# Patient Record
Sex: Female | Born: 1980 | Race: White | Hispanic: No | Marital: Married | Smoking: Current every day smoker
Health system: Southern US, Community
[De-identification: ages and names within clinical notes are randomized; demographics above are authoritative.]

## PROBLEM LIST (undated history)

## (undated) ENCOUNTER — Emergency Department (HOSPITAL_BASED_OUTPATIENT_CLINIC_OR_DEPARTMENT_OTHER): Payer: BLUE CROSS/BLUE SHIELD

## (undated) DIAGNOSIS — B1920 Unspecified viral hepatitis C without hepatic coma: Secondary | ICD-10-CM

## (undated) HISTORY — PX: TUBAL LIGATION: SHX77

## (undated) HISTORY — PX: CHOLECYSTECTOMY: SHX55

---

## 2010-10-29 ENCOUNTER — Emergency Department (HOSPITAL_BASED_OUTPATIENT_CLINIC_OR_DEPARTMENT_OTHER)
Admission: EM | Admit: 2010-10-29 | Discharge: 2010-10-29 | Disposition: A | Discharge status: Home / Self Care | Payer: Self-pay | Attending: Emergency Medicine | Admitting: Emergency Medicine

## 2010-10-29 DIAGNOSIS — L03119 Cellulitis of unspecified part of limb: Secondary | ICD-10-CM | POA: Insufficient documentation

## 2010-10-29 DIAGNOSIS — L02419 Cutaneous abscess of limb, unspecified: Secondary | ICD-10-CM | POA: Insufficient documentation

## 2010-11-02 LAB — CULTURE, ROUTINE-ABSCESS: Gram Stain: NONE SEEN

## 2011-03-04 ENCOUNTER — Emergency Department (HOSPITAL_BASED_OUTPATIENT_CLINIC_OR_DEPARTMENT_OTHER)
Admission: EM | Admit: 2011-03-04 | Discharge: 2011-03-04 | Disposition: A | Discharge status: Home / Self Care | Payer: Self-pay | Attending: Emergency Medicine | Admitting: Emergency Medicine

## 2011-03-04 ENCOUNTER — Emergency Department (INDEPENDENT_AMBULATORY_CARE_PROVIDER_SITE_OTHER): Payer: Self-pay

## 2011-03-04 ENCOUNTER — Encounter: Payer: Self-pay | Admitting: *Deleted

## 2011-03-04 DIAGNOSIS — R112 Nausea with vomiting, unspecified: Secondary | ICD-10-CM

## 2011-03-04 DIAGNOSIS — R1031 Right lower quadrant pain: Secondary | ICD-10-CM | POA: Insufficient documentation

## 2011-03-04 DIAGNOSIS — F17200 Nicotine dependence, unspecified, uncomplicated: Secondary | ICD-10-CM | POA: Insufficient documentation

## 2011-03-04 HISTORY — DX: Unspecified viral hepatitis C without hepatic coma: B19.20

## 2011-03-04 LAB — URINALYSIS, ROUTINE W REFLEX MICROSCOPIC
Bilirubin Urine: NEGATIVE
Nitrite: NEGATIVE
Specific Gravity, Urine: 1.023 (ref 1.005–1.030)
pH: 6 (ref 5.0–8.0)

## 2011-03-04 LAB — CBC
HCT: 41.1 % (ref 36.0–46.0)
MCV: 82.4 fL (ref 78.0–100.0)
Platelets: 297 10*3/uL (ref 150–400)
RBC: 4.99 MIL/uL (ref 3.87–5.11)
WBC: 12.4 10*3/uL — ABNORMAL HIGH (ref 4.0–10.5)

## 2011-03-04 LAB — COMPREHENSIVE METABOLIC PANEL
ALT: 64 U/L — ABNORMAL HIGH (ref 0–35)
Alkaline Phosphatase: 83 U/L (ref 39–117)
CO2: 27 mEq/L (ref 19–32)
Calcium: 9.6 mg/dL (ref 8.4–10.5)
Chloride: 105 mEq/L (ref 96–112)
GFR calc Af Amer: >90 mL/min (ref >90)
GFR calc non Af Amer: >90 mL/min (ref >90)
Glucose, Bld: 95 mg/dL (ref 70–99)
Potassium: 4 mEq/L (ref 3.5–5.1)
Sodium: 142 mEq/L (ref 135–145)
Total Bilirubin: 0.2 mg/dL — ABNORMAL LOW (ref 0.3–1.2)

## 2011-03-04 LAB — URINE MICROSCOPIC-ADD ON

## 2011-03-04 LAB — DIFFERENTIAL
Eosinophils Relative: 4 % (ref 0–5)
Lymphocytes Relative: 34 % (ref 12–46)
Lymphs Abs: 4.2 10*3/uL — ABNORMAL HIGH (ref 0.7–4.0)

## 2011-03-04 MED ORDER — ONDANSETRON HCL 4 MG/2ML IJ SOLN
4.0000 mg | Freq: Once | INTRAMUSCULAR | Status: AC
  Administered 2011-03-04: 4 mg via INTRAVENOUS
  Filled 2011-03-04: qty 2

## 2011-03-04 MED ORDER — MORPHINE SULFATE 4 MG/ML IJ SOLN
4.0000 mg | Freq: Once | INTRAMUSCULAR | Status: AC
  Administered 2011-03-04: 4 mg via INTRAVENOUS
  Filled 2011-03-04: qty 1

## 2011-03-04 MED ORDER — METOCLOPRAMIDE HCL 5 MG/ML IJ SOLN
10.0000 mg | Freq: Once | INTRAMUSCULAR | Status: AC
  Administered 2011-03-04: 10 mg via INTRAVENOUS

## 2011-03-04 MED ORDER — ONDANSETRON HCL 4 MG/2ML IJ SOLN
4.0000 mg | Freq: Once | INTRAMUSCULAR | Status: AC
  Administered 2011-03-04: 4 mg via INTRAVENOUS

## 2011-03-04 MED ORDER — IOHEXOL 300 MG/ML  SOLN
100.0000 mL | Freq: Once | INTRAMUSCULAR | Status: AC | PRN
  Administered 2011-03-04: 100 mL via INTRAVENOUS

## 2011-03-04 MED ORDER — ONDANSETRON 8 MG PO TBDP: 8.0000 mg | ORAL_TABLET | Freq: Three times a day (TID) | ORAL | Status: AC | PRN

## 2011-03-04 MED ORDER — METOCLOPRAMIDE HCL 5 MG/ML IJ SOLN
INTRAMUSCULAR | Status: AC
  Filled 2011-03-04: qty 2

## 2011-03-04 MED ORDER — NAPROXEN 500 MG PO TABS: 500.0000 mg | ORAL_TABLET | Freq: Two times a day (BID) | ORAL | Status: AC

## 2011-03-04 MED ORDER — ONDANSETRON HCL 4 MG/2ML IJ SOLN
INTRAMUSCULAR | Status: AC
  Filled 2011-03-04: qty 2

## 2011-03-04 MED ORDER — KETOROLAC TROMETHAMINE 30 MG/ML IJ SOLN
30.0000 mg | Freq: Once | INTRAMUSCULAR | Status: AC
  Administered 2011-03-04: 30 mg via INTRAVENOUS
  Filled 2011-03-04: qty 1

## 2011-03-04 MED ORDER — SODIUM CHLORIDE 0.9 % IV BOLUS (SEPSIS)
1000.0000 mL | Freq: Once | INTRAVENOUS | Status: AC
  Administered 2011-03-04: 1000 mL via INTRAVENOUS

## 2011-03-04 MED ORDER — HYDROCODONE-ACETAMINOPHEN 5-325 MG PO TABS: 1.0000 | ORAL_TABLET | Freq: Four times a day (QID) | ORAL | Status: AC | PRN

## 2011-03-04 NOTE — ED Notes (Signed)
C/o RLQ pains since 2pm yesterday afternoon. Described as dull and constant, with intermittent sharp periods. Denies N/V/D or fevers. Denies urinary symptoms. Began her period 10days early, which is unusual for her.

## 2011-03-04 NOTE — ED Notes (Signed)
Pt vomited x 1 episode. MD made aware and zofran given. Pt states occasional sharp pain in abd with associated flushed feeling and vomiting.

## 2011-03-04 NOTE — ED Provider Notes (Signed)
History     CSN: [id number] Arrival date & time: 03/04/2011  9:00 PM   First MD Initiated Contact with Michele Walker 03/04/11 2104      Chief Complaint  Michele Walker presents with  . Abdominal Pain    (Consider location/radiation/quality/duration/timing/severity/associated sxs/prior treatment) Michele Walker is a 30 y.o. female presenting with abdominal pain. The history is provided by the Michele Walker.  Abdominal Pain The primary symptoms of the illness include abdominal pain. The primary symptoms of the illness do not include fever, nausea, vomiting, diarrhea or dysuria. The current episode started yesterday. The problem has been gradually worsening.  The abdominal pain is located in the RLQ. The abdominal pain does not radiate.  The Michele Walker states that she believes she is currently not pregnant. The Michele Walker has not had a change in bowel habit. Symptoms associated with the illness do not include heartburn, urgency, hematuria or back pain. Associated symptoms comments: The Michele Walker has had vaginal bleeding and states her period has been 10 days early..   the pain does not increase with eating. It does increase with movement and palpation  Past Medical History  Diagnosis Date  . Hepatitis C     Past Surgical History  Procedure Date  . Cesarean section   . Cholecystectomy     No family history on file.  History  Substance Use Topics  . Smoking status: Current Everyday Smoker  . Smokeless tobacco: Not on file  . Alcohol Use: Yes    OB History    Grav Para Term Preterm Abortions TAB SAB Ect Mult Living                  Review of Systems  Constitutional: Negative for fever.  Gastrointestinal: Positive for abdominal pain. Negative for heartburn, nausea, vomiting and diarrhea.  Genitourinary: Negative for dysuria, urgency and hematuria.  Musculoskeletal: Negative for back pain.  All other systems reviewed and are negative.    Allergies  Review of Michele Walker's allergies indicates not on  file.  Home Medications  No current outpatient prescriptions on file.  BP 140/88  Pulse 90  Temp(Src) 98.5 F (36.9 C) (Oral)  Resp 18  Ht 5\' 7"  (1.702 m)  Wt 220 lb (99.791 kg)  BMI 34.46 kg/m2  SpO2 100%  LMP 03/03/2011  Physical Exam  Nursing note and vitals reviewed. Constitutional: She appears well-developed and well-nourished. No distress.  HENT:  Head: Normocephalic and atraumatic.  Right Ear: External ear normal.  Left Ear: External ear normal.  Eyes: Conjunctivae are normal. Right eye exhibits no discharge. Left eye exhibits no discharge. No scleral icterus.  Neck: Neck supple. No tracheal deviation present.  Cardiovascular: Normal rate, regular rhythm and intact distal pulses.   Pulmonary/Chest: Effort normal and breath sounds normal. No stridor. No respiratory distress. She has no wheezes. She has no rales.  Abdominal: Soft. Bowel sounds are normal. She exhibits no distension. There is tenderness in the right upper quadrant and right lower quadrant. There is no rebound, no guarding and no CVA tenderness. No hernia.  Musculoskeletal: She exhibits no edema and no tenderness.  Neurological: She is alert. She has normal strength. No sensory deficit. Cranial nerve deficit:  no gross defecits noted. She exhibits normal muscle tone. She displays no seizure activity. Coordination normal.  Skin: Skin is warm and dry. No rash noted.  Psychiatric: She has a normal mood and affect.    ED Course  Procedures (including critical care time) 9:39 PM  Michele Walker is having persistent pain. On pelvic  exam she did have no cervical motion tenderness or uterine tenderness. She had some right adnexal tenderness on certain that he not be related to her appendix. She Michele Walker will be given additional pain medications and I will order abdominal pelvic CT Labs Reviewed  URINALYSIS, ROUTINE W REFLEX MICROSCOPIC - Abnormal; Notable for the following:    Hgb urine dipstick MODERATE (*)    All other  components within normal limits  CBC - Abnormal; Notable for the following:    WBC 12.4 (*)    All other components within normal limits  DIFFERENTIAL - Abnormal; Notable for the following:    Lymphs Abs 4.2 (*)    All other components within normal limits  PREGNANCY, URINE  URINE MICROSCOPIC-ADD ON  COMPREHENSIVE METABOLIC PANEL  LIPASE, BLOOD  GC/CHLAMYDIA PROBE AMP, GENITAL  RPR   Ct Abdomen Pelvis W Contrast  03/04/2011  *RADIOLOGY REPORT*  Clinical Data: Right lower quadrant pain, nausea and vomiting  CT ABDOMEN AND PELVIS WITH CONTRAST  Technique:  Multidetector CT imaging of the abdomen and pelvis was performed following the standard protocol during bolus administration of intravenous contrast.  Contrast: OMNIPAQUE IOHEXOL 300 MG/ML IV SOLN  Comparison: None.  Findings: The lung bases are clear.  The liver, spleen, pancreas, adrenal glands, kidneys, abdominal aorta, and retroperitoneal lymph nodes are unremarkable.  Surgical absence of the gallbladder.  The stomach and small bowel are decompressed.  No free air or free fluid in the abdomen.  Pelvis:  The appendix is normal.  The colon is decompressed. Scattered diverticula in the sigmoid colon without inflammatory change.  The bladder is decompressed.  The uterus and adnexal structures are not enlarged.  No significant pelvic lymphadenopathy.  Normal alignment of the lumbar vertebrae.  IMPRESSION: Surgical absence of gallbladder.  No acute abnormalities demonstrated in the abdomen or pelvis.  Normal appendix.  Original Report Authenticated By: Marlon Pel, M.D.     1. Abdominal pain       MDM  11:13 PM the Michele Walker is feeling better at this time.  her abdominal pain has subsided. Michele Walker without evidence of acute pathology on her abdominal pelvic CT scan. In retrospect I suspect her pain is related to either an ovarian cyst were severe dysmenorrhea associated with her regular menstrual bleeding. At this point there does  not appear to be any evidence of an acute emergency medical condition. Michele Walker can safely followup with her gynecologist        Celene Kras, MD 03/04/11 2314

## 2011-07-06 ENCOUNTER — Emergency Department (INDEPENDENT_AMBULATORY_CARE_PROVIDER_SITE_OTHER): Payer: No Typology Code available for payment source

## 2011-07-06 ENCOUNTER — Encounter (HOSPITAL_BASED_OUTPATIENT_CLINIC_OR_DEPARTMENT_OTHER): Payer: Self-pay

## 2011-07-06 ENCOUNTER — Emergency Department (HOSPITAL_BASED_OUTPATIENT_CLINIC_OR_DEPARTMENT_OTHER)
Admission: EM | Admit: 2011-07-06 | Discharge: 2011-07-06 | Disposition: A | Discharge status: Home / Self Care | Payer: No Typology Code available for payment source | Attending: Emergency Medicine | Admitting: Emergency Medicine

## 2011-07-06 DIAGNOSIS — S0990XA Unspecified injury of head, initial encounter: Secondary | ICD-10-CM

## 2011-07-06 DIAGNOSIS — B1920 Unspecified viral hepatitis C without hepatic coma: Secondary | ICD-10-CM | POA: Insufficient documentation

## 2011-07-06 DIAGNOSIS — M542 Cervicalgia: Secondary | ICD-10-CM | POA: Insufficient documentation

## 2011-07-06 DIAGNOSIS — M25519 Pain in unspecified shoulder: Secondary | ICD-10-CM | POA: Insufficient documentation

## 2011-07-06 DIAGNOSIS — R51 Headache: Secondary | ICD-10-CM | POA: Insufficient documentation

## 2011-07-06 DIAGNOSIS — F17200 Nicotine dependence, unspecified, uncomplicated: Secondary | ICD-10-CM | POA: Insufficient documentation

## 2011-07-06 DIAGNOSIS — R6884 Jaw pain: Secondary | ICD-10-CM | POA: Insufficient documentation

## 2011-07-06 DIAGNOSIS — H538 Other visual disturbances: Secondary | ICD-10-CM

## 2011-07-06 DIAGNOSIS — S161XXA Strain of muscle, fascia and tendon at neck level, initial encounter: Secondary | ICD-10-CM

## 2011-07-06 MED ORDER — LORAZEPAM 1 MG PO TABS: 1.0000 mg | ORAL_TABLET | Freq: Three times a day (TID) | ORAL | Status: AC | PRN

## 2011-07-06 MED ORDER — LORAZEPAM 1 MG PO TABS
2.0000 mg | ORAL_TABLET | Freq: Once | ORAL | Status: AC
  Administered 2011-07-06: 2 mg via ORAL
  Filled 2011-07-06: qty 2
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
## 2012-08-20 ENCOUNTER — Emergency Department (HOSPITAL_BASED_OUTPATIENT_CLINIC_OR_DEPARTMENT_OTHER): Payer: Self-pay

## 2012-08-20 ENCOUNTER — Encounter (HOSPITAL_BASED_OUTPATIENT_CLINIC_OR_DEPARTMENT_OTHER): Payer: Self-pay | Admitting: *Deleted

## 2012-08-20 ENCOUNTER — Emergency Department (HOSPITAL_BASED_OUTPATIENT_CLINIC_OR_DEPARTMENT_OTHER)
Admission: EM | Admit: 2012-08-20 | Discharge: 2012-08-20 | Disposition: A | Discharge status: Home / Self Care | Payer: Self-pay | Attending: Emergency Medicine | Admitting: Emergency Medicine

## 2012-08-20 DIAGNOSIS — Y9339 Activity, other involving climbing, rappelling and jumping off: Secondary | ICD-10-CM | POA: Insufficient documentation

## 2012-08-20 DIAGNOSIS — X500XXA Overexertion from strenuous movement or load, initial encounter: Secondary | ICD-10-CM | POA: Insufficient documentation

## 2012-08-20 DIAGNOSIS — S93409A Sprain of unspecified ligament of unspecified ankle, initial encounter: Secondary | ICD-10-CM | POA: Insufficient documentation

## 2012-08-20 DIAGNOSIS — Z8619 Personal history of other infectious and parasitic diseases: Secondary | ICD-10-CM | POA: Insufficient documentation

## 2012-08-20 DIAGNOSIS — F17200 Nicotine dependence, unspecified, uncomplicated: Secondary | ICD-10-CM | POA: Insufficient documentation

## 2012-08-20 DIAGNOSIS — Y9289 Other specified places as the place of occurrence of the external cause: Secondary | ICD-10-CM | POA: Insufficient documentation

## 2012-08-20 DIAGNOSIS — Y9383 Activity, rough housing and horseplay: Secondary | ICD-10-CM | POA: Insufficient documentation

## 2012-08-20 MED ORDER — HYDROCODONE-ACETAMINOPHEN 5-325 MG PO TABS: 1.0000 | ORAL_TABLET | Freq: Four times a day (QID) | ORAL | Status: DC | PRN

## 2012-08-20 MED ORDER — NAPROXEN 500 MG PO TABS: 500.0000 mg | ORAL_TABLET | Freq: Two times a day (BID) | ORAL | Status: DC

## 2012-08-20 MED ORDER — HYDROCODONE-ACETAMINOPHEN 5-325 MG PO TABS
1.0000 | ORAL_TABLET | Freq: Once | ORAL | Status: AC
  Administered 2012-08-20: 1 via ORAL

## 2012-08-20 MED ORDER — HYDROCODONE-ACETAMINOPHEN 5-325 MG PO TABS
ORAL_TABLET | ORAL | Status: AC
  Administered 2012-08-20: 1 via ORAL
  Filled 2012-08-20: qty 1

## 2012-08-20 NOTE — ED Notes (Signed)
Pt injured her left ankle in "bumper jumpers". Ice applied at triage. Obvious deformity. PMS intact, but "feels numb"

## 2012-08-20 NOTE — ED Provider Notes (Addendum)
History     CSN: [id number]  Arrival date & time 08/20/12  1338   First MD Initiated Contact with Patient 08/20/12 1354      Chief Complaint  Patient presents with  . Ankle Injury    (Consider location/radiation/quality/duration/timing/severity/associated sxs/prior treatment) Patient is a 32 y.o. female presenting with lower extremity injury. The history is provided by the patient.  Ankle Injury Pertinent negatives include no chest pain, no abdominal pain, no headaches and no shortness of breath.   patient presents with left ankle injury while jumping in a child's playhouse. Patient heard a pop or snap. Lots of swelling to the left lateral ankle. Pain is 10 out of 10. The pain from the left lateral ankle radiates up towards the knee. Not able to ambulate. No other injuries. Patient denies chest pain abdominal pain neck pain or head pain no other extremity injury. Pain is made worse by trying to ambulate. Or by moving the foot.  Past Medical History  Diagnosis Date  . Hepatitis C   . Hepatitis C     Past Surgical History  Procedure Laterality Date  . Cesarean section    . Cholecystectomy      History reviewed. No pertinent family history.  History  Substance Use Topics  . Smoking status: Current Every Day Smoker -- 0.50 packs/day    Types: Cigarettes  . Smokeless tobacco: Not on file  . Alcohol Use: Yes     Comment: rarely    OB History   Grav Para Term Preterm Abortions TAB SAB Ect Mult Living                  Review of Systems  Constitutional: Negative for fever.  HENT: Negative for congestion.   Respiratory: Negative for shortness of breath.   Cardiovascular: Negative for chest pain.  Gastrointestinal: Negative for nausea, vomiting and abdominal pain.  Musculoskeletal: Negative for back pain.  Skin: Negative for rash.  Neurological: Negative for headaches.  Hematological: Does not bruise/bleed easily.  Psychiatric/Behavioral: Negative for confusion.     Allergies  Morphine and related and Penicillins  Home Medications   Current Outpatient Rx  Name  Route  Sig  Dispense  Refill  . HYDROcodone-acetaminophen (NORCO/VICODIN) 5-325 MG per tablet   Oral   Take 1-2 tablets by mouth every 6 (six) hours as needed for pain.   20 tablet   0   . naproxen (NAPROSYN) 500 MG tablet   Oral   Take 1 tablet (500 mg total) by mouth 2 (two) times daily.   14 tablet   0     BP 138/96  Pulse 76  Temp(Src) 97.6 F (36.4 C) (Oral)  Resp 20  Ht 5\' 8"  (1.727 m)  Wt 235 lb (106.595 kg)  BMI 35.74 kg/m2  SpO2 98%  LMP 07/30/2012  Physical Exam  Nursing note and vitals reviewed. Constitutional: She is oriented to person, place, and time. She appears well-developed and well-nourished. She appears distressed.  HENT:  Head: Normocephalic and atraumatic.  Eyes: Conjunctivae and EOM are normal. Pupils are equal, round, and reactive to light.  Neck: Normal range of motion.  Cardiovascular: Normal rate, regular rhythm and normal heart sounds.   No murmur heard. Pulmonary/Chest: Effort normal and breath sounds normal. No respiratory distress.  Abdominal: Soft. Bowel sounds are normal. There is no tenderness.  Musculoskeletal: Normal range of motion. She exhibits edema and tenderness.  Arms and legs normal except for the left ankle but has a lot  of lateral swelling. No medial swelling no proximal fibula tenderness. Dorsalis pedis pulses 2+ Refill to the great toe is 2 seconds. Some movement to all toes. Sensation intact.  Neurological: She is alert and oriented to person, place, and time. No cranial nerve deficit. She exhibits normal muscle tone. Coordination normal.  Skin: Skin is warm. No rash noted.    ED Course  Procedures (including critical care time)  Labs Reviewed - No data to display Dg Ankle Complete Left  08/20/2012  *RADIOLOGY REPORT*  Clinical Data: Left ankle injury, with lateral pain and swelling. Limited weight bearing and  limited range of motion.  LEFT ANKLE COMPLETE - 3+ VIEW  Comparison: None.  Findings: Prominent soft tissue swelling overlying the lateral malleolus observed.  Plafond and talar dome appear intact.  No malleolar fracture noted.  Lateral projection obtained in prominent plantar flexion of the ankle.  Suspected tibiotalar joint effusion.  Plantar and Achilles calcaneal spurs observed.  IMPRESSION:  1.  Prominent abnormal soft tissue swelling overlying the lateral malleolus, without underlying fracture observed. 2.  Tibiotalar joint effusion. 3.  Plantar calcaneal spurs are advanced for age.   Original Report Authenticated By: Gaylyn Rong, M.D.      1. Ankle sprain and strain, left, initial encounter       MDM  X-rays of the left ankle show no bony fracture therefore this is consistent with a fairly significant sprain. Patient will be treated with crutches ASO and pain medication and understands very important elevated referral provided to sports medicine at this facility. Work note provided.        Shelda Jakes, MD 08/20/12 1439  Shelda Jakes, MD 08/20/12 [phone]

## 2012-12-11 ENCOUNTER — Emergency Department (HOSPITAL_BASED_OUTPATIENT_CLINIC_OR_DEPARTMENT_OTHER)
Admission: EM | Admit: 2012-12-11 | Discharge: 2012-12-11 | Disposition: A | Discharge status: Home / Self Care | Payer: Self-pay | Attending: Emergency Medicine | Admitting: Emergency Medicine

## 2012-12-11 ENCOUNTER — Emergency Department (HOSPITAL_BASED_OUTPATIENT_CLINIC_OR_DEPARTMENT_OTHER): Payer: Self-pay

## 2012-12-11 ENCOUNTER — Encounter (HOSPITAL_BASED_OUTPATIENT_CLINIC_OR_DEPARTMENT_OTHER): Payer: Self-pay | Admitting: *Deleted

## 2012-12-11 DIAGNOSIS — R6883 Chills (without fever): Secondary | ICD-10-CM | POA: Insufficient documentation

## 2012-12-11 DIAGNOSIS — R112 Nausea with vomiting, unspecified: Secondary | ICD-10-CM | POA: Insufficient documentation

## 2012-12-11 DIAGNOSIS — Z8619 Personal history of other infectious and parasitic diseases: Secondary | ICD-10-CM | POA: Insufficient documentation

## 2012-12-11 DIAGNOSIS — N898 Other specified noninflammatory disorders of vagina: Secondary | ICD-10-CM | POA: Insufficient documentation

## 2012-12-11 DIAGNOSIS — Z9889 Other specified postprocedural states: Secondary | ICD-10-CM | POA: Insufficient documentation

## 2012-12-11 DIAGNOSIS — F17200 Nicotine dependence, unspecified, uncomplicated: Secondary | ICD-10-CM | POA: Insufficient documentation

## 2012-12-11 DIAGNOSIS — N949 Unspecified condition associated with female genital organs and menstrual cycle: Secondary | ICD-10-CM | POA: Insufficient documentation

## 2012-12-11 DIAGNOSIS — Z880 Allergy status to penicillin: Secondary | ICD-10-CM | POA: Insufficient documentation

## 2012-12-11 DIAGNOSIS — R109 Unspecified abdominal pain: Secondary | ICD-10-CM | POA: Insufficient documentation

## 2012-12-11 DIAGNOSIS — Z3202 Encounter for pregnancy test, result negative: Secondary | ICD-10-CM | POA: Insufficient documentation

## 2012-12-11 DIAGNOSIS — Z9089 Acquired absence of other organs: Secondary | ICD-10-CM | POA: Insufficient documentation

## 2012-12-11 DIAGNOSIS — N72 Inflammatory disease of cervix uteri: Secondary | ICD-10-CM | POA: Insufficient documentation

## 2012-12-11 LAB — CBC
Hemoglobin: 13.8 g/dL (ref 12.0–15.0)
MCHC: 33.4 g/dL (ref 30.0–36.0)
WBC: 11.9 10*3/uL — ABNORMAL HIGH (ref 4.0–10.5)

## 2012-12-11 LAB — COMPREHENSIVE METABOLIC PANEL
ALT: 55 U/L — ABNORMAL HIGH (ref 0–35)
Albumin: 3.8 g/dL (ref 3.5–5.2)
Alkaline Phosphatase: 76 U/L (ref 39–117)
Chloride: 101 mEq/L (ref 96–112)
GFR calc Af Amer: >90 mL/min (ref >90)
Glucose, Bld: 122 mg/dL — ABNORMAL HIGH (ref 70–99)
Potassium: 4 mEq/L (ref 3.5–5.1)
Sodium: 140 mEq/L (ref 135–145)
Total Bilirubin: 0.6 mg/dL (ref 0.3–1.2)
Total Protein: 7.6 g/dL (ref 6.0–8.3)

## 2012-12-11 LAB — URINALYSIS, ROUTINE W REFLEX MICROSCOPIC
Glucose, UA: NEGATIVE mg/dL
Hgb urine dipstick: NEGATIVE
Protein, ur: NEGATIVE mg/dL
Specific Gravity, Urine: 1.019 (ref 1.005–1.030)
pH: 7 (ref 5.0–8.0)

## 2012-12-11 LAB — WET PREP, GENITAL
Clue Cells Wet Prep HPF POC: NONE SEEN
Trich, Wet Prep: NONE SEEN
Yeast Wet Prep HPF POC: NONE SEEN

## 2012-12-11 LAB — PREGNANCY, URINE: Preg Test, Ur: NEGATIVE

## 2012-12-11 LAB — URINE MICROSCOPIC-ADD ON

## 2012-12-11 MED ORDER — PROMETHAZINE HCL 25 MG/ML IJ SOLN
12.5000 mg | Freq: Once | INTRAMUSCULAR | Status: AC
  Administered 2012-12-11: 12.5 mg via INTRAVENOUS
  Filled 2012-12-11: qty 1

## 2012-12-11 MED ORDER — SODIUM CHLORIDE 0.9 % IV BOLUS (SEPSIS)
1000.0000 mL | Freq: Once | INTRAVENOUS | Status: AC
  Administered 2012-12-11: 1000 mL via INTRAVENOUS

## 2012-12-11 MED ORDER — HYDROCODONE-ACETAMINOPHEN 5-325 MG PO TABS: 2.0000 | ORAL_TABLET | ORAL | Status: DC | PRN

## 2012-12-11 MED ORDER — LIDOCAINE HCL (PF) 1 % IJ SOLN
INTRAMUSCULAR | Status: AC
  Administered 2012-12-11: 2.1 mL
  Filled 2012-12-11: qty 5

## 2012-12-11 MED ORDER — IOHEXOL 300 MG/ML  SOLN
100.0000 mL | Freq: Once | INTRAMUSCULAR | Status: AC | PRN
  Administered 2012-12-11: 100 mL via INTRAVENOUS

## 2012-12-11 MED ORDER — CEFTRIAXONE SODIUM 250 MG IJ SOLR
250.0000 mg | Freq: Once | INTRAMUSCULAR | Status: AC
  Administered 2012-12-11: 250 mg via INTRAMUSCULAR
  Filled 2012-12-11: qty 250

## 2012-12-11 MED ORDER — HYDROMORPHONE HCL PF 1 MG/ML IJ SOLN
1.0000 mg | Freq: Once | INTRAMUSCULAR | Status: AC
  Administered 2012-12-11: 1 mg via INTRAVENOUS
  Filled 2012-12-11: qty 1

## 2012-12-11 MED ORDER — ONDANSETRON HCL 4 MG/2ML IJ SOLN
4.0000 mg | Freq: Once | INTRAMUSCULAR | Status: AC
  Administered 2012-12-11: 4 mg via INTRAVENOUS
  Filled 2012-12-11: qty 2

## 2012-12-11 MED ORDER — AZITHROMYCIN 250 MG PO TABS
1000.0000 mg | ORAL_TABLET | Freq: Once | ORAL | Status: DC
  Filled 2012-12-11: qty 4

## 2012-12-11 MED ORDER — IOHEXOL 300 MG/ML  SOLN: 50.0000 mL | Freq: Once | INTRAMUSCULAR | Status: DC | PRN

## 2012-12-11 MED ORDER — DOXYCYCLINE HYCLATE 100 MG PO CAPS: 100.0000 mg | ORAL_CAPSULE | Freq: Two times a day (BID) | ORAL | Status: DC

## 2012-12-11 NOTE — ED Notes (Signed)
Pt c/o lower pelvic pain freq painful urination

## 2012-12-11 NOTE — ED Provider Notes (Signed)
CSN: [id number]     Arrival date & time 12/11/12  1317 History     First MD Initiated Contact with Patient 12/11/12 1323     Chief Complaint  Patient presents with  . Dysuria   (Consider location/radiation/quality/duration/timing/severity/associated sxs/prior Treatment) HPI  Past Medical History  Diagnosis Date  . Hepatitis C   . Hepatitis C    Past Surgical History  Procedure Laterality Date  . Cesarean section    . Cholecystectomy     History reviewed. No pertinent family history. History  Substance Use Topics  . Smoking status: Current Every Day Smoker -- 0.50 packs/day    Types: Cigarettes  . Smokeless tobacco: Not on file  . Alcohol Use: Yes     Comment: rarely   OB History   Grav Para Term Preterm Abortions TAB SAB Ect Mult Living                 Review of Systems  Allergies  Morphine and related and Penicillins  Home Medications  No current outpatient prescriptions on file. BP 127/86  Pulse 64  Temp(Src) 98.2 F (36.8 C) (Oral)  Resp 16  Ht 5\' 8"  (1.727 m)  Wt 220 lb (99.791 kg)  BMI 33.46 kg/m2  SpO2 99%  LMP 11/27/2012 Physical Exam  ED Course   Procedures (including critical care time)  Labs Reviewed  WET PREP, GENITAL - Abnormal; Notable for the following:    WBC, Wet Prep HPF POC FEW (*)    All other components within normal limits  URINALYSIS, ROUTINE W REFLEX MICROSCOPIC - Abnormal; Notable for the following:    APPearance CLOUDY (*)    Leukocytes, UA TRACE (*)    All other components within normal limits  CBC - Abnormal; Notable for the following:    WBC 11.9 (*)    All other components within normal limits  COMPREHENSIVE METABOLIC PANEL - Abnormal; Notable for the following:    Glucose, Bld 122 (*)    AST 38 (*)    ALT 55 (*)    All other components within normal limits  URINE MICROSCOPIC-ADD ON - Abnormal; Notable for the following:    Squamous Epithelial / LPF MANY (*)    Bacteria, UA FEW (*)    All other components  within normal limits  GC/CHLAMYDIA PROBE AMP  PREGNANCY, URINE   Ct Abdomen Pelvis W Contrast  12/11/2012   *RADIOLOGY REPORT*  Clinical Data: 32 year old female with abdominal/pelvic pain, vomiting and dysuria.  CT ABDOMEN AND PELVIS WITH CONTRAST  Technique:  Multidetector CT imaging of the abdomen and pelvis was performed following the standard protocol during bolus administration of intravenous contrast.  Contrast: OMNIPAQUE IOHEXOL 300 MG/ML  SOLN  Comparison: 03/04/2011 CT  Findings: The liver, spleen, pancreas, adrenal glands and kidneys are unremarkable. The patient is status post cholecystectomy.  A very small amount free fluid in the pelvis is noted and most likely physiologic.  There is no evidence of biliary dilatation, enlarged lymph nodes or abdominal aortic aneurysm.  The bowel, bladder and appendix are unremarkable. There is no evidence of bowel obstruction or pneumoperitoneum. The uterus and adnexal regions are within normal limits.  No acute or suspicious bony abnormalities are identified.  IMPRESSION: Very small line of free pelvic fluid which is likely physiologic.  No other significant abnormalities identified.   Original Report Authenticated By: Harmon Pier, M.D.   No diagnosis found.  MDM  Ultrasound normal.   Pt advised to recheck with her primary  care MD in 2-3 days.    Lonia Skinner Fallon, PA-C 12/11/12 1842

## 2012-12-11 NOTE — ED Notes (Signed)
EDPA in to talk with pt-US ordered and hold on po and im abx

## 2012-12-11 NOTE — ED Provider Notes (Signed)
Complains of suprapubic pain onset gradually 2 days ago. Pain is improved with urination. Worse when she has bowel movements She admits to multiple episodes of vomiting today. Patient had similar pain in the past, etiology unclear. No treatment prior to coming here  Doug Sou, MD 12/11/12 1440

## 2012-12-11 NOTE — ED Provider Notes (Signed)
Medical screening examination/treatment/procedure(s) were performed by non-physician practitioner and as supervising physician I was immediately available for consultation/collaboration.  Ethelda Chick, MD 12/11/12 [phone]

## 2012-12-11 NOTE — ED Provider Notes (Signed)
CSN: [id number]     Arrival date & time 12/11/12  1317 History     First MD Initiated Contact with Patient 12/11/12 1323     Chief Complaint  Patient presents with  . Dysuria   (Consider location/radiation/quality/duration/timing/severity/associated sxs/prior Treatment) HPI Pt is a 32yo female with hx of Hepatitis C and cholecystectomy presenting today with lower abdominal pain, nausea and vomiting that started Saturday, 7/26.  Pt states she vomited over 20x yesterday.  Pain is sharp, cramping "contraction-like" in suprapubic, RLQ.  Reports hx of possible ovarian cyst in the past, however pain has never been this bad.  States she broke out in sweats last night the pain was so bad, she had to take an ice cold bath to help relief symptoms.  Denies pain with urination, states urinating actually helps relieve pain, it feels like pressure is relieved.  Pain is worse with BM.  Reports been having regular BM w/o blood.  Pt still has appendix.  Denies fever, vaginal discharge or pain.    Past Medical History  Diagnosis Date  . Hepatitis C   . Hepatitis C    Past Surgical History  Procedure Laterality Date  . Cesarean section    . Cholecystectomy     History reviewed. No pertinent family history. History  Substance Use Topics  . Smoking status: Current Every Day Smoker -- 0.50 packs/day    Types: Cigarettes  . Smokeless tobacco: Not on file  . Alcohol Use: Yes     Comment: rarely   OB History   Grav Para Term Preterm Abortions TAB SAB Ect Mult Living                 Review of Systems  Constitutional: Positive for chills and diaphoresis. Negative for fever.  Gastrointestinal: Positive for nausea, vomiting and abdominal pain. Negative for diarrhea and constipation.  Genitourinary: Positive for pelvic pain. Negative for dysuria, urgency, frequency, flank pain, vaginal discharge and vaginal pain.  All other systems reviewed and are negative.    Allergies  Morphine and related and  Penicillins  Home Medications  No current outpatient prescriptions on file. BP 140/88  Pulse 112  Temp(Src) 98.2 F (36.8 C) (Oral)  Resp 16  Ht 5\' 8"  (1.727 m)  Wt 220 lb (99.791 kg)  BMI 33.46 kg/m2  SpO2 99%  LMP 11/27/2012 Physical Exam  Nursing note and vitals reviewed. Constitutional: She appears well-developed and well-nourished. No distress.  Pt holding lower abdomen, as well as emesis bag.  Appears moderately uncomfortable.   HENT:  Head: Normocephalic and atraumatic.  Eyes: Conjunctivae are normal. No scleral icterus.  Neck: Normal range of motion.  Cardiovascular: Normal rate, regular rhythm and normal heart sounds.   Pulmonary/Chest: Effort normal and breath sounds normal. No respiratory distress. She has no wheezes. She has no rales. She exhibits no tenderness.  Abdominal: Soft. Bowel sounds are normal. She exhibits no distension and no mass. There is tenderness. There is guarding. There is no rebound.  Genitourinary: Uterus normal. Pelvic exam was performed with patient supine. No labial fusion. There is no rash, tenderness, lesion or injury on the right labia. There is no rash, tenderness, lesion or injury on the left labia. Cervix exhibits motion tenderness and discharge ( scant white-clear discharge). Cervix exhibits no friability. Right adnexum displays tenderness. Right adnexum displays no mass and no fullness. Left adnexum displays no mass, no tenderness and no fullness. No erythema, tenderness or bleeding around the vagina. No foreign body around the  vagina. No signs of injury around the vagina. Vaginal discharge ( scant, clear) found.  CMT and right adnexal tenderness without mass.  Scant clear-white discharge.   Musculoskeletal: Normal range of motion.  Neurological: She is alert.  Skin: Skin is warm and dry. She is not diaphoretic.    ED Course   Procedures (including critical care time)  Labs Reviewed  WET PREP, GENITAL - Abnormal; Notable for the  following:    WBC, Wet Prep HPF POC FEW (*)    All other components within normal limits  URINALYSIS, ROUTINE W REFLEX MICROSCOPIC - Abnormal; Notable for the following:    APPearance CLOUDY (*)    Leukocytes, UA TRACE (*)    All other components within normal limits  CBC - Abnormal; Notable for the following:    WBC 11.9 (*)    All other components within normal limits  COMPREHENSIVE METABOLIC PANEL - Abnormal; Notable for the following:    Glucose, Bld 122 (*)    AST 38 (*)    ALT 55 (*)    All other components within normal limits  URINE MICROSCOPIC-ADD ON - Abnormal; Notable for the following:    Squamous Epithelial / LPF MANY (*)    Bacteria, UA FEW (*)    All other components within normal limits  GC/CHLAMYDIA PROBE AMP  PREGNANCY, URINE   Ct Abdomen Pelvis W Contrast  12/11/2012   *RADIOLOGY REPORT*  Clinical Data: 32 year old female with abdominal/pelvic pain, vomiting and dysuria.  CT ABDOMEN AND PELVIS WITH CONTRAST  Technique:  Multidetector CT imaging of the abdomen and pelvis was performed following the standard protocol during bolus administration of intravenous contrast.  Contrast: OMNIPAQUE IOHEXOL 300 MG/ML  SOLN  Comparison: 03/04/2011 CT  Findings: The liver, spleen, pancreas, adrenal glands and kidneys are unremarkable. The patient is status post cholecystectomy.  A very small amount free fluid in the pelvis is noted and most likely physiologic.  There is no evidence of biliary dilatation, enlarged lymph nodes or abdominal aortic aneurysm.  The bowel, bladder and appendix are unremarkable. There is no evidence of bowel obstruction or pneumoperitoneum. The uterus and adnexal regions are within normal limits.  No acute or suspicious bony abnormalities are identified.  IMPRESSION: Very small line of free pelvic fluid which is likely physiologic.  No other significant abnormalities identified.   Original Report Authenticated By: Harmon Pier, M.D.   No diagnosis  found.  MDM  Concern for appendicitis vs genitourinary cause of pt's symptoms.  Will perform pelvic, CT abd, labs. Tx: dilaudid, zofran, phenergan, fluids.    Pelvic exam: CMT, right adnexal tenderness w/o adnexal masses.   Wet prep: unremarkable CBC: mild increased WBC-11.9 CMP: consistent with previous labs 55yr ago UA: unremarkable Urine preg: neg.    CT abd: very small line of free pelvic fluid, likely physiologic.  No other significant abnormalities ID.  Pt states she feels pain and nausea coming back, 8/10. Giving another dose of dilaudid and phenergan.   5:11 PM Offered to tx pt empirically for PID/cystitis with azithromycin and rocephin.  Pt states she is in a monogomous marriage and does not believe STD is likely due to duration of worsening pain (over 2 years).   U/S is now available.  Advised pt she can not get a pelvic U/S to get a better picture of her uterus and ovaries.     Signed out to [employer].  Plan is to discharge pt home with symptomatic tx, pain and nausea  medication.  Pt is to f/u with OB/GYN for continued pelvic pain.        Junius Finner, PA-C 12/11/12 1714

## 2012-12-12 LAB — GC/CHLAMYDIA PROBE AMP
CT Probe RNA: NEGATIVE
GC Probe RNA: NEGATIVE

## 2012-12-12 NOTE — ED Provider Notes (Signed)
Medical screening examination/treatment/procedure(s) were conducted as a shared visit with non-physician practitioner(s) and myself.  I personally evaluated the patient during the encounter  Doug Sou, MD 12/12/12 1544

## 2013-08-25 ENCOUNTER — Emergency Department (HOSPITAL_BASED_OUTPATIENT_CLINIC_OR_DEPARTMENT_OTHER)
Admission: EM | Admit: 2013-08-25 | Discharge: 2013-08-25 | Disposition: A | Discharge status: Home / Self Care | Payer: Medicaid Other | Attending: Emergency Medicine | Admitting: Emergency Medicine

## 2013-08-25 ENCOUNTER — Emergency Department (HOSPITAL_BASED_OUTPATIENT_CLINIC_OR_DEPARTMENT_OTHER): Payer: Medicaid Other

## 2013-08-25 ENCOUNTER — Encounter (HOSPITAL_BASED_OUTPATIENT_CLINIC_OR_DEPARTMENT_OTHER): Payer: Self-pay | Admitting: Emergency Medicine

## 2013-08-25 DIAGNOSIS — O165 Unspecified maternal hypertension, complicating the puerperium: Secondary | ICD-10-CM | POA: Insufficient documentation

## 2013-08-25 DIAGNOSIS — I10 Essential (primary) hypertension: Secondary | ICD-10-CM

## 2013-08-25 DIAGNOSIS — R519 Headache, unspecified: Secondary | ICD-10-CM

## 2013-08-25 DIAGNOSIS — R21 Rash and other nonspecific skin eruption: Secondary | ICD-10-CM | POA: Insufficient documentation

## 2013-08-25 DIAGNOSIS — H538 Other visual disturbances: Secondary | ICD-10-CM | POA: Insufficient documentation

## 2013-08-25 DIAGNOSIS — O26899 Other specified pregnancy related conditions, unspecified trimester: Secondary | ICD-10-CM | POA: Insufficient documentation

## 2013-08-25 DIAGNOSIS — O99335 Smoking (tobacco) complicating the puerperium: Secondary | ICD-10-CM | POA: Insufficient documentation

## 2013-08-25 DIAGNOSIS — Z880 Allergy status to penicillin: Secondary | ICD-10-CM | POA: Insufficient documentation

## 2013-08-25 DIAGNOSIS — R51 Headache: Secondary | ICD-10-CM

## 2013-08-25 DIAGNOSIS — O909 Complication of the puerperium, unspecified: Secondary | ICD-10-CM

## 2013-08-25 DIAGNOSIS — Z79899 Other long term (current) drug therapy: Secondary | ICD-10-CM | POA: Insufficient documentation

## 2013-08-25 DIAGNOSIS — Z792 Long term (current) use of antibiotics: Secondary | ICD-10-CM | POA: Insufficient documentation

## 2013-08-25 DIAGNOSIS — Z8619 Personal history of other infectious and parasitic diseases: Secondary | ICD-10-CM | POA: Insufficient documentation

## 2013-08-25 LAB — URINE MICROSCOPIC-ADD ON

## 2013-08-25 LAB — BASIC METABOLIC PANEL
BUN: 10 mg/dL (ref 6–23)
CHLORIDE: 100 meq/L (ref 96–112)
CO2: 27 mEq/L (ref 19–32)
Calcium: 9.1 mg/dL (ref 8.4–10.5)
Creatinine, Ser: 0.8 mg/dL (ref 0.50–1.10)
Glucose, Bld: 96 mg/dL (ref 70–99)
Potassium: 4 mEq/L (ref 3.7–5.3)
Sodium: 141 mEq/L (ref 137–147)

## 2013-08-25 LAB — URINALYSIS, ROUTINE W REFLEX MICROSCOPIC
BILIRUBIN URINE: NEGATIVE
Glucose, UA: NEGATIVE mg/dL
Ketones, ur: NEGATIVE mg/dL
LEUKOCYTES UA: NEGATIVE
NITRITE: NEGATIVE
PH: 7 (ref 5.0–8.0)
Protein, ur: NEGATIVE mg/dL
SPECIFIC GRAVITY, URINE: 1.009 (ref 1.005–1.030)
UROBILINOGEN UA: 0.2 mg/dL (ref 0.0–1.0)

## 2013-08-25 LAB — CBC
HCT: 34.4 % — ABNORMAL LOW (ref 36.0–46.0)
Hemoglobin: 11.3 g/dL — ABNORMAL LOW (ref 12.0–15.0)
MCH: 28.5 pg (ref 26.0–34.0)
MCHC: 32.8 g/dL (ref 30.0–36.0)
MCV: 86.9 fL (ref 78.0–100.0)
PLATELETS: 397 10*3/uL (ref 150–400)
RBC: 3.96 MIL/uL (ref 3.87–5.11)
RDW: 14.3 % (ref 11.5–15.5)
WBC: 14.7 10*3/uL — ABNORMAL HIGH (ref 4.0–10.5)

## 2013-08-25 MED ORDER — IBUPROFEN 600 MG PO TABS: 600.0000 mg | ORAL_TABLET | Freq: Four times a day (QID) | ORAL | Status: DC | PRN

## 2013-08-25 MED ORDER — SODIUM CHLORIDE 0.9 % IV BOLUS (SEPSIS)
1000.0000 mL | Freq: Once | INTRAVENOUS | Status: AC
  Administered 2013-08-25: 1000 mL via INTRAVENOUS

## 2013-08-25 MED ORDER — METOCLOPRAMIDE HCL 5 MG/ML IJ SOLN
10.0000 mg | Freq: Once | INTRAMUSCULAR | Status: AC
  Administered 2013-08-25: 10 mg via INTRAVENOUS
  Filled 2013-08-25: qty 2

## 2013-08-25 MED ORDER — KETOROLAC TROMETHAMINE 30 MG/ML IJ SOLN
30.0000 mg | Freq: Once | INTRAMUSCULAR | Status: AC
  Administered 2013-08-25: 30 mg via INTRAVENOUS
  Filled 2013-08-25: qty 1

## 2013-08-25 MED ORDER — METOCLOPRAMIDE HCL 10 MG PO TABS: 10.0000 mg | ORAL_TABLET | Freq: Four times a day (QID) | ORAL | Status: DC

## 2013-08-25 MED ORDER — DIPHENHYDRAMINE HCL 50 MG/ML IJ SOLN
25.0000 mg | Freq: Once | INTRAMUSCULAR | Status: AC
  Administered 2013-08-25: 25 mg via INTRAVENOUS
  Filled 2013-08-25: qty 1

## 2013-08-25 NOTE — ED Notes (Addendum)
Pt reports headache x 3 days- Pt had c-section on March 30- tried percocet, ibuprofen, claritin, goody's, caffeine, and tylenol without relief- states "worst headache of my life"

## 2013-08-25 NOTE — ED Provider Notes (Signed)
CSN: [id number]     Arrival date & time 08/25/13  1625 History  This chart was scribed for Ethelda Chick, MD by Bronson Curb, ED Scribe. This patient was seen in room MH10/MH10 and the patient's care was started at 5:40 PM.  Chief Complaint  Patient presents with  . Headache    Patient is a 33 y.o. female presenting with headaches. The history is provided by the patient. No language interpreter was used.  Headache Pain location:  Generalized Radiates to:  L shoulder, R shoulder, L neck and R neck Severity at highest:  10/10 (States this is the worse HA of her life) Duration:  3 days Timing:  Constant Progression:  Unchanged Chronicity:  New Similar to prior headaches: no   Associated symptoms: blurred vision   Associated symptoms: no fever, no nausea, no numbness, no visual change and no vomiting     HPI Comments: Michele Walker is a 33 y.o.  with history of recent C-section delivery who presents to the Emergency Department complaining a constant, throbbing HA that began 3 days ago. She states that headache changes locations and sometimes radiates to her posterior neck and shoulders. Patient rates headache as 10/10 and says that it is the" worst headache of her life." She reports associated blurred vision. Patient further states that while she was attempting to provide a urine sample in the ED, she had difficulty with coordinating her movements. She denies numbness or weakness of extremities or face, double vision, speech changes, or aphasia. Patient had a Cesarean section 11 days ago and went home after 3 days in the hospital. She states that since then, she developed some swelling in her feet, but this has improved. She has noticed some pus from her surgical site, but she saw her OB 4 days ago, who felt that this was unconcerning for infection. She has a history of gestational hypertension and was on methyldopa, but she was taken off of this after her C-section because her blood  pressure significantly improved. She states that during her pregnancy it was generally in the 140s/90s, but at the highest was 170s/100s. She says that her blood pressure was elevated at her OB's office on Tuesday, but he attributed this to excitement, and she was generally feeling well that day. Patient also states that she has a history of Bell's Palsy on both sides of her face (different episodes) and has some mild baseline facial droop on her left side. She has also noticed some redness to her upper chest today. Her past medical history includes Hep C and her surgical history also includes cholecystectomy and tubal ligation.   Past Medical History  Diagnosis Date  . Hepatitis C   . Hepatitis C    Past Surgical History  Procedure Laterality Date  . Cesarean section    . Cholecystectomy    . Tubal ligation     No family history on file. History  Substance Use Topics  . Smoking status: Current Every Day Smoker -- 0.50 packs/day    Types: Cigarettes  . Smokeless tobacco: Not on file  . Alcohol Use: Yes     Comment: rarely   OB History   Grav Para Term Preterm Abortions TAB SAB Ect Mult Living                 Review of Systems  Constitutional: Negative for fever.  Eyes: Positive for blurred vision and visual disturbance.  Gastrointestinal: Negative for nausea and vomiting.  Skin: Positive for  rash.  Neurological: Positive for headaches. Negative for speech difficulty, weakness and numbness.  All other systems reviewed and are negative.   Allergies  Morphine and related and Penicillins  Home Medications   Current Outpatient Rx  Name  Route  Sig  Dispense  Refill  . ferrous sulfate 325 (65 FE) MG tablet   Oral   Take 325 mg by mouth daily with breakfast.         . doxycycline (VIBRAMYCIN) 100 MG capsule   Oral   Take 1 capsule (100 mg total) by mouth 2 (two) times daily.   20 capsule   0   . HYDROcodone-acetaminophen (NORCO/VICODIN) 5-325 MG per tablet   Oral    Take 2 tablets by mouth every 4 (four) hours as needed.   20 tablet   0   . ibuprofen (ADVIL,MOTRIN) 600 MG tablet   Oral   Take 1 tablet (600 mg total) by mouth every 6 (six) hours as needed.   30 tablet   0   . metoCLOPramide (REGLAN) 10 MG tablet   Oral   Take 1 tablet (10 mg total) by mouth every 6 (six) hours.   30 tablet   0    Triage Vitals: BP 157/87  Pulse 55  Temp(Src) 98.7 F (37.1 C) (Oral)  Resp 18  SpO2 96% Physical Exam  Nursing note and vitals reviewed. Constitutional: She is oriented to person, place, and time. She appears well-developed and well-nourished. No distress.  HENT:  Head: Normocephalic and atraumatic.  Eyes: EOM are normal.  Neck: Neck supple. No tracheal deviation present.  Cardiovascular: Normal rate.   Pulmonary/Chest: Effort normal. No respiratory distress.  Musculoskeletal: Normal range of motion.  Neurological: She is alert and oriented to person, place, and time.  Skin: Skin is warm and dry.  Psychiatric: She has a normal mood and affect. Her behavior is normal.  Note- awake and alert,oriented x 3, cranial nerves 2-12 tested and intact, strength 5/5 in extremities x 4, sensation intact, lungs- CTAB, CV- RRR, no murmur, Neck- FROM, supple, no meningismus  ED Course  Procedures (including critical care time) DIAGNOSTIC STUDIES: Oxygen Saturation is 98% on RA, normal by my interpretation.    COORDINATION OF CARE: 5:58 PM - Will order head CT, CBC, BMP, urinalysis, and IV fluids, Reglan and Benadryl. Pt advised of plan for treatment and pt agrees.   Labs Review Labs Reviewed  URINALYSIS, ROUTINE W REFLEX MICROSCOPIC - Abnormal; Notable for the following:    Hgb urine dipstick SMALL (*)    All other components within normal limits  URINE MICROSCOPIC-ADD ON - Abnormal; Notable for the following:    Squamous Epithelial / LPF FEW (*)    Bacteria, UA FEW (*)    All other components within normal limits  CBC - Abnormal; Notable for the  following:    WBC 14.7 (*)    Hemoglobin 11.3 (*)    HCT 34.4 (*)    All other components within normal limits  BASIC METABOLIC PANEL    Imaging Review Ct Head Wo Contrast  08/25/2013   CLINICAL DATA:  Throbbing headache status post C-section  EXAM: CT HEAD WITHOUT CONTRAST  TECHNIQUE: Contiguous axial images were obtained from the base of the skull through the vertex without intravenous contrast.  COMPARISON:  07/06/2011  FINDINGS: No acute cortical infarct, hemorrhage, or mass lesion ispresent. Ventricles are of normal size. No significant extra-axial fluid collection is present. The paranasal sinuses andmastoid air cells are clear. The  osseous skull is intact. Retention cysts versus polyps noted in the maxillary sinuses. The mastoid air cells appear clear.  IMPRESSION: 1. No acute intracranial abnormalities.   Electronically Signed   By: Signa Kell M.D.   On: 08/25/2013 18:21     EKG Interpretation   Date/Time:  Saturday August 25 2013 18:20:13 EDT Ventricular Rate:  55 PR Interval:  162 QRS Duration: 92 QT Interval:  502 QTC Calculation: 480 R Axis:   84 Text Interpretation:  Sinus bradycardia Prolonged QT Abnormal ECG No old  tracing to compare Confirmed by [facility]  MD, Astin Sayre [phone]) on 08/25/2013  11:45:22 PM      MDM   Final diagnoses:  Headache  Hypertension   Pt presenting with c/o headache, this was gradual in onset. Doubt SAH given gradual onset. she is 11 days post partum after csxn.  She has a normal neuro exam.  Headache is completely resolved after migraine cocktail.  She is somewhat hypertensive, which was improved after headache resolution.  There is low suspicion for venous sinus thrombosis but given patient is postpartum with new heaedache, MRI is ordered to be completed as an outpatient.  Discharged with strict return precautions.  Pt agreeable with plan.  I personally performed the services described in this documentation, which was scribed in my presence.  The recorded information has been reviewed and is accurate.    Ethelda Chick, MD 08/26/13 [phone]

## 2013-08-25 NOTE — Discharge Instructions (Signed)
Return to the ED with any concerns including weakness of arms or legs, changes in vision or speech, fever/chills, fainting, decreased level of alertness/lethargy, or any other alarming symptoms

## 2013-08-28 ENCOUNTER — Ambulatory Visit (HOSPITAL_BASED_OUTPATIENT_CLINIC_OR_DEPARTMENT_OTHER): Admit: 2013-08-28 | Payer: Medicaid Other

## 2014-04-02 ENCOUNTER — Encounter (HOSPITAL_BASED_OUTPATIENT_CLINIC_OR_DEPARTMENT_OTHER): Payer: Self-pay | Admitting: *Deleted

## 2014-04-02 ENCOUNTER — Emergency Department (HOSPITAL_BASED_OUTPATIENT_CLINIC_OR_DEPARTMENT_OTHER)
Admission: EM | Admit: 2014-04-02 | Discharge: 2014-04-02 | Disposition: A | Discharge status: Home / Self Care | Payer: Medicaid Other | Attending: Emergency Medicine | Admitting: Emergency Medicine

## 2014-04-02 ENCOUNTER — Emergency Department (HOSPITAL_BASED_OUTPATIENT_CLINIC_OR_DEPARTMENT_OTHER): Payer: Medicaid Other

## 2014-04-02 DIAGNOSIS — Z8619 Personal history of other infectious and parasitic diseases: Secondary | ICD-10-CM | POA: Insufficient documentation

## 2014-04-02 DIAGNOSIS — Z792 Long term (current) use of antibiotics: Secondary | ICD-10-CM | POA: Insufficient documentation

## 2014-04-02 DIAGNOSIS — Z720 Tobacco use: Secondary | ICD-10-CM | POA: Insufficient documentation

## 2014-04-02 DIAGNOSIS — Z79899 Other long term (current) drug therapy: Secondary | ICD-10-CM | POA: Insufficient documentation

## 2014-04-02 DIAGNOSIS — Z880 Allergy status to penicillin: Secondary | ICD-10-CM | POA: Insufficient documentation

## 2014-04-02 DIAGNOSIS — M79662 Pain in left lower leg: Secondary | ICD-10-CM

## 2014-04-02 DIAGNOSIS — M79605 Pain in left leg: Secondary | ICD-10-CM | POA: Insufficient documentation

## 2014-04-02 DIAGNOSIS — M2669 Other specified disorders of temporomandibular joint: Secondary | ICD-10-CM

## 2014-04-02 MED ORDER — CYCLOBENZAPRINE HCL 5 MG PO TABS: 5.0000 mg | ORAL_TABLET | Freq: Three times a day (TID) | ORAL | Status: DC | PRN

## 2014-04-02 MED ORDER — IBUPROFEN 600 MG PO TABS: 600.0000 mg | ORAL_TABLET | Freq: Four times a day (QID) | ORAL | Status: DC | PRN

## 2014-04-02 MED ORDER — HYDROCODONE-ACETAMINOPHEN 5-325 MG PO TABS: 1.0000 | ORAL_TABLET | Freq: Four times a day (QID) | ORAL | Status: DC | PRN

## 2014-04-02 NOTE — Discharge Instructions (Signed)
Your ultrasound today shows no blood clot in your calf. Your exam shows you may have inflammation of the TMJ. They medications we give you to treat the TMJ should help the muscle strain as well. Follow up with your dentist for further evaluation of the jaw pain.

## 2014-04-02 NOTE — ED Provider Notes (Signed)
CSN: [id number]     Arrival date & time 04/02/14  1226 History   First MD Initiated Contact with Patient 04/02/14 1244     Chief Complaint  Patient presents with  . Jaw Pain     (Consider location/radiation/quality/duration/timing/severity/associated sxs/prior Treatment) The history is provided by the patient.   Michele Walker is a 33 y.o. female who presents to the ED with facial pain that started 3 weeks ago. The pain is usually worse every morning and starts to feel a little better as the day goes on. The past few days the pain is worse. She also complains of left calf pain that started yesterday. She has a tiny area of redness that she thinks resulted from where she shaved. There is a BB side knot near the open area that is tender as well.   Past Medical History  Diagnosis Date  . Hepatitis C   . Hepatitis C    Past Surgical History  Procedure Laterality Date  . Cesarean section    . Cholecystectomy    . Tubal ligation     No family history on file. History  Substance Use Topics  . Smoking status: Current Every Day Smoker -- 0.50 packs/day    Types: Cigarettes  . Smokeless tobacco: Never Used  . Alcohol Use: No     Comment: rarely   OB History    No data available     Review of Systems Negative except as stated in HPI   Allergies  Morphine and related and Penicillins  Home Medications   Prior to Admission medications   Medication Sig Start Date End Date Taking? Authorizing Provider  doxycycline (VIBRAMYCIN) 100 MG capsule Take 1 capsule (100 mg total) by mouth 2 (two) times daily. 12/11/12   Elson AreasLeslie K Sofia, PA-C  ferrous sulfate 325 (65 FE) MG tablet Take 325 mg by mouth daily with breakfast.    Historical Provider, MD  HYDROcodone-acetaminophen (NORCO/VICODIN) 5-325 MG per tablet Take 2 tablets by mouth every 4 (four) hours as needed. 12/11/12   Elson AreasLeslie K Sofia, PA-C  ibuprofen (ADVIL,MOTRIN) 600 MG tablet Take 1 tablet (600 mg total) by mouth every 6 (six) hours  as needed. 08/25/13   Ethelda ChickMartha K Linker, MD  metoCLOPramide (REGLAN) 10 MG tablet Take 1 tablet (10 mg total) by mouth every 6 (six) hours. 08/25/13   Ethelda ChickMartha K Linker, MD   BP 143/99 mmHg  Pulse 100  Temp(Src) 98.3 F (36.8 C) (Oral)  Resp 20  Ht 5\' 8"  (1.727 m)  Wt 240 lb (108.863 kg)  BMI 36.50 kg/m2  SpO2 98%  LMP 03/12/2014 Physical Exam  Constitutional: She is oriented to person, place, and time. She appears well-developed and well-nourished. No distress.  HENT:  Head: Normocephalic.    Right Ear: Tympanic membrane normal.  Left Ear: Tympanic membrane normal.  Nose: Nose normal.  Mouth/Throat: Uvula is midline, oropharynx is clear and moist and mucous membranes are normal.  Tender with palpation at left TMJ and pain with closing mouth.   Eyes: EOM are normal.  Neck: Neck supple.  Cardiovascular: Normal rate.   Pulmonary/Chest: Effort normal.  Musculoskeletal:       Left lower leg: She exhibits tenderness. She exhibits no swelling.       Legs: Pedal pulses equal, adequate circulation, good touch sensation.   Lymphadenopathy:    She has no cervical adenopathy.  Neurological: She is alert and oriented to person, place, and time. No cranial nerve deficit.  Ambulatory without difficulty.  Skin: Skin is warm and dry.  Psychiatric: She has a normal mood and affect. Her behavior is normal.  Nursing note and vitals reviewed.   ED Course  Procedures  [place] Venous Img Lower Unilateral Left  04/02/2014   CLINICAL DATA:  33 year old female with left lower extremity numbness and pain for 2 days. Calf pain. Initial encounter.  EXAM: LEFT LOWER EXTREMITY VENOUS DOPPLER ULTRASOUND  TECHNIQUE: Gray-scale sonography with graded compression, as well as color Doppler and duplex ultrasound were performed to evaluate the lower extremity deep venous systems from the level of the common femoral vein and including the common femoral, femoral, profunda femoral, popliteal and calf veins including the  posterior tibial, peroneal and gastrocnemius veins when visible. The superficial great saphenous vein was also interrogated. Spectral Doppler was utilized to evaluate flow at rest and with distal augmentation maneuvers in the common femoral, femoral and popliteal veins.  COMPARISON:  None.  FINDINGS: Contralateral Common Femoral Vein: Respiratory phasicity is normal and symmetric with the symptomatic side. No evidence of thrombus. Normal compressibility.  Common Femoral Vein: No evidence of thrombus. Normal compressibility, respiratory phasicity and response to augmentation.  Saphenofemoral Junction: No evidence of thrombus. Normal compressibility and flow on color Doppler imaging.  Profunda Femoral Vein: No evidence of thrombus. Normal compressibility and flow on color Doppler imaging.  Femoral Vein: No evidence of thrombus. Normal compressibility, respiratory phasicity and response to augmentation.  Popliteal Vein: No evidence of thrombus. Normal compressibility, respiratory phasicity and response to augmentation.  Calf Veins: No evidence of thrombus. Normal compressibility and flow on color Doppler imaging.  Superficial Great Saphenous Vein: No evidence of thrombus. Normal compressibility and flow on color Doppler imaging.  Venous Reflux:  None.  Other Findings:  None.  IMPRESSION: No evidence of left lower extremity deep venous thrombosis.   Electronically Signed   By: Augusto GambleLee  Hall M.D.   On: 04/02/2014 14:26    MDM  33 y.o. female with left jaw pain x 3 weeks and left calf pain x 1 day. Will treat for TMJ inflammation and left leg muscle strain. Discussed with the patient need for follow up with her dentist for further evaluation of the TMJ. She voices understanding and agrees with plan. Stable for discharge without DVT, no fever or trismus.    Medication List    STOP taking these medications        doxycycline 100 MG capsule  Commonly known as:  VIBRAMYCIN     ferrous sulfate 325 (65 FE) MG tablet       metoCLOPramide 10 MG tablet  Commonly known as:  REGLAN      TAKE these medications        cyclobenzaprine 5 MG tablet  Commonly known as:  FLEXERIL  Take 1 tablet (5 mg total) by mouth 3 (three) times daily as needed for muscle spasms.     HYDROcodone-acetaminophen 5-325 MG per tablet  Commonly known as:  NORCO/VICODIN  Take 1 tablet by mouth every 6 (six) hours as needed.     ibuprofen 600 MG tablet  Commonly known as:  ADVIL,MOTRIN  Take 1 tablet (600 mg total) by mouth every 6 (six) hours as needed.              HoumaHope M Neese, NP 04/02/14 1530  Vanetta MuldersScott Zackowski, MD 04/02/14 1535

## 2014-04-02 NOTE — ED Notes (Signed)
Left side jaw pain x 3 weeks, worsening sx, hurts to close, also c/o left leg tingling and cramping x 1 day

## 2014-08-25 ENCOUNTER — Emergency Department (HOSPITAL_BASED_OUTPATIENT_CLINIC_OR_DEPARTMENT_OTHER)
Admission: EM | Admit: 2014-08-25 | Discharge: 2014-08-25 | Disposition: A | Discharge status: Home / Self Care | Payer: Medicaid Other | Attending: Emergency Medicine | Admitting: Emergency Medicine

## 2014-08-25 ENCOUNTER — Encounter (HOSPITAL_BASED_OUTPATIENT_CLINIC_OR_DEPARTMENT_OTHER): Payer: Self-pay | Admitting: *Deleted

## 2014-08-25 DIAGNOSIS — Z9889 Other specified postprocedural states: Secondary | ICD-10-CM | POA: Insufficient documentation

## 2014-08-25 DIAGNOSIS — Z720 Tobacco use: Secondary | ICD-10-CM | POA: Insufficient documentation

## 2014-08-25 DIAGNOSIS — N764 Abscess of vulva: Secondary | ICD-10-CM | POA: Insufficient documentation

## 2014-08-25 DIAGNOSIS — Z8619 Personal history of other infectious and parasitic diseases: Secondary | ICD-10-CM | POA: Insufficient documentation

## 2014-08-25 DIAGNOSIS — Z880 Allergy status to penicillin: Secondary | ICD-10-CM | POA: Insufficient documentation

## 2014-08-25 DIAGNOSIS — Z9851 Tubal ligation status: Secondary | ICD-10-CM | POA: Insufficient documentation

## 2014-08-25 MED ORDER — LIDOCAINE-EPINEPHRINE (PF) 2 %-1:200000 IJ SOLN
10.0000 mL | Freq: Once | INTRAMUSCULAR | Status: AC
  Administered 2014-08-25: 10 mL via INTRADERMAL
  Filled 2014-08-25: qty 10

## 2014-08-25 MED ORDER — OXYCODONE-ACETAMINOPHEN 5-325 MG PO TABS
2.0000 | ORAL_TABLET | Freq: Once | ORAL | Status: AC
  Administered 2014-08-25: 2 via ORAL
  Filled 2014-08-25: qty 2

## 2014-08-25 MED ORDER — OXYCODONE-ACETAMINOPHEN 5-325 MG PO TABS: 1.0000 | ORAL_TABLET | Freq: Four times a day (QID) | ORAL | Status: DC | PRN

## 2014-08-25 MED ORDER — SULFAMETHOXAZOLE-TRIMETHOPRIM 800-160 MG PO TABS: 1.0000 | ORAL_TABLET | Freq: Two times a day (BID) | ORAL | Status: AC

## 2014-08-25 NOTE — ED Notes (Signed)
Abscess on left labia x 1 day

## 2014-08-25 NOTE — ED Notes (Signed)
Pt d/c'd by other RN 

## 2014-08-25 NOTE — ED Provider Notes (Signed)
CSN: [id number]     Arrival date & time 08/25/14  1801 History   First MD Initiated Contact with Patient 08/25/14 2035     Chief Complaint  Patient presents with  . Abscess     (Consider location/radiation/quality/duration/timing/severity/associated sxs/prior Treatment) HPI Comments: 34 year old female presenting with an abscess on her left labia 1 day. Reports having abscesses in the past, however states this is the largest abscess she has ever had. Size has significantly increased over the past day, and today she had milky drainage. Reports severe pain when walking to that area described as stinging. No alleviating factors. Denies fevers.  Patient is a 34 y.o. female presenting with abscess. The history is provided by the patient.  Abscess   Past Medical History  Diagnosis Date  . Hepatitis C   . Hepatitis C    Past Surgical History  Procedure Laterality Date  . Cesarean section    . Cholecystectomy    . Tubal ligation     No family history on file. History  Substance Use Topics  . Smoking status: Current Every Day Smoker -- 0.50 packs/day    Types: Cigarettes  . Smokeless tobacco: Never Used  . Alcohol Use: No     Comment: rarely   OB History    No data available     Review of Systems  Skin: Positive for color change.       + Abscess.  All other systems reviewed and are negative.     Allergies  Morphine and related and Penicillins  Home Medications   Prior to Admission medications   Medication Sig Start Date End Date Taking? Authorizing Provider  cyclobenzaprine (FLEXERIL) 5 MG tablet Take 1 tablet (5 mg total) by mouth 3 (three) times daily as needed for muscle spasms. 04/02/14   Hope Orlene Och, NP  HYDROcodone-acetaminophen (NORCO/VICODIN) 5-325 MG per tablet Take 1 tablet by mouth every 6 (six) hours as needed. 04/02/14   Hope Orlene Och, NP  ibuprofen (ADVIL,MOTRIN) 600 MG tablet Take 1 tablet (600 mg total) by mouth every 6 (six) hours as needed. 04/02/14    Hope Orlene Och, NP  oxyCODONE-acetaminophen (PERCOCET) 5-325 MG per tablet Take 1-2 tablets by mouth every 6 (six) hours as needed for severe pain. 08/25/14   Kindrick Lankford M Marycarmen Hagey, PA-C  sulfamethoxazole-trimethoprim (BACTRIM DS,SEPTRA DS) 800-160 MG per tablet Take 1 tablet by mouth 2 (two) times daily. 08/25/14 09/01/14  Adasyn Mcadams M Marcas Bowsher, PA-C   BP 142/95 mmHg  Pulse 87  Temp(Src) 98.8 F (37.1 C) (Oral)  Resp 20  Ht  (1.702 m)  Wt 246 lb (111.585 kg)  BMI 38.52 kg/m2  SpO2 100%  LMP 08/18/2014 Physical Exam  Constitutional: She is oriented to person, place, and time. She appears well-developed and well-nourished. No distress.  HENT:  Head: Normocephalic and atraumatic.  Mouth/Throat: Oropharynx is clear and moist.  Eyes: Conjunctivae and EOM are normal.  Neck: Normal range of motion. Neck supple.  Cardiovascular: Normal rate, regular rhythm and normal heart sounds.   Pulmonary/Chest: Effort normal and breath sounds normal. No respiratory distress.  Genitourinary:     Mild tenderness over mons pubis. No erythema or warmth.  Musculoskeletal: Normal range of motion. She exhibits no edema.  Lymphadenopathy:       Left: Inguinal adenopathy present.  Neurological: She is alert and oriented to person, place, and time. No sensory deficit.  Skin: Skin is warm and dry.  Psychiatric: She has a normal mood and affect. Her behavior  is normal.  Nursing note and vitals reviewed.   ED Course  Procedures (including critical care time) INCISION AND DRAINAGE Performed by: Celene Skeenobyn Urias Sheek Consent: Verbal consent obtained. Risks and benefits: risks, benefits and alternatives were discussed Type: abscess  Body area: left labia  Anesthesia: local infiltration  Incision was made with a scalpel.  Local anesthetic: lidocaine 2% with epinephrine  Anesthetic total: 1 ml  Complexity: complex Blunt dissection to break up loculations  Drainage: purulent  Drainage amount: large  Packing material:  none  Patient tolerance: Patient tolerated the procedure well with no immediate complications.    Labs Review Labs Reviewed - No data to display  Imaging Review No results found.   EKG Interpretation None      MDM   Final diagnoses:  Left genital labial abscess   Abscess drained. Left inguinal adenopathy noted mild tenderness of pubis. Doubt deep pelvic abscess. Afebrile. Abscess drained. Will start patient on Bactrim given the area of the abscess. Advised warm compresses. Rx Percocet for pain. Resources given for follow-up. Stable for discharge. Return precautions given. Patient states understanding of treatment care plan and is agreeable.  Discussed with attending Dr. Silverio LayYao who also evaluated patient and agrees with plan of care.    Kathrynn SpeedRobyn M Shemuel Harkleroad, PA-C 08/25/14 2144  Richardean Canalavid H Yao, MD 08/25/14 [phone]

## 2014-08-25 NOTE — ED Notes (Addendum)
Pt alert, NAD, calm, tearful, interactive, updated, [facility]  Pt seen by EDPA prior to RN assessment, see PA notes, orders received and initiated.

## 2014-08-25 NOTE — ED Notes (Signed)
ED PA at [facility] 

## 2014-08-25 NOTE — Discharge Instructions (Signed)
Take percocet for severe pain only. No driving or operating heavy machinery while taking percocet. This medication may cause drowsiness. Take bactrim twice daily for 1 week. Continue warm soaks and compresses.  Abscess An abscess is an infected area that contains a collection of pus and debris.It can occur in almost any part of the body. An abscess is also known as a furuncle or boil. CAUSES  An abscess occurs when tissue gets infected. This can occur from blockage of oil or sweat glands, infection of hair follicles, or a minor injury to the skin. As the body tries to fight the infection, pus collects in the area and creates pressure under the skin. This pressure causes pain. People with weakened immune systems have difficulty fighting infections and get certain abscesses more often.  SYMPTOMS Usually an abscess develops on the skin and becomes a painful mass that is red, warm, and tender. If the abscess forms under the skin, you may feel a moveable soft area under the skin. Some abscesses break open (rupture) on their own, but most will continue to get worse without care. The infection can spread deeper into the body and eventually into the bloodstream, causing you to feel ill.  DIAGNOSIS  Your caregiver will take your medical history and perform a physical exam. A sample of fluid may also be taken from the abscess to determine what is causing your infection. TREATMENT  Your caregiver may prescribe antibiotic medicines to fight the infection. However, taking antibiotics alone usually does not cure an abscess. Your caregiver may need to make a small cut (incision) in the abscess to drain the pus. In some cases, gauze is packed into the abscess to reduce pain and to continue draining the area. HOME CARE INSTRUCTIONS   Only take over-the-counter or prescription medicines for pain, discomfort, or fever as directed by your caregiver.  If you were prescribed antibiotics, take them as directed. Finish  them even if you start to feel better.  If gauze is used, follow your caregiver's directions for changing the gauze.  To avoid spreading the infection:  Keep your draining abscess covered with a bandage.  Wash your hands well.  Do not share personal care items, towels, or whirlpools with others.  Avoid skin contact with others.  Keep your skin and clothes clean around the abscess.  Keep all follow-up appointments as directed by your caregiver. SEEK MEDICAL CARE IF:   You have increased pain, swelling, redness, fluid drainage, or bleeding.  You have muscle aches, chills, or a general ill feeling.  You have a fever. MAKE SURE YOU:   Understand these instructions.  Will watch your condition.  Will get help right away if you are not doing well or get worse. Document Released: 02/10/2005 Document Revised: 11/02/2011 Document Reviewed: 07/16/2011 [facility] Patient Information 2015 [place], [place]. This information is not intended to replace advice given to you by your health care provider. Make sure you discuss any questions you have with your health care provider.  Abscess Care After An abscess (also called a boil or furuncle) is an infected area that contains a collection of pus. Signs and symptoms of an abscess include pain, tenderness, redness, or hardness, or you may feel a moveable soft area under your skin. An abscess can occur anywhere in the body. The infection may spread to surrounding tissues causing cellulitis. A cut (incision) by the surgeon was made over your abscess and the pus was drained out. Gauze may have been packed into the space  to provide a drain that will allow the cavity to heal from the inside outwards. The boil may be painful for 5 to 7 days. Most people with a boil do not have high fevers. Your abscess, if seen early, may not have localized, and may not have been lanced. If not, another appointment may be required for this if it does not get better on its own  or with medications. HOME CARE INSTRUCTIONS   Only take over-the-counter or prescription medicines for pain, discomfort, or fever as directed by your caregiver.  When you bathe, soak and then remove gauze or iodoform packs at least daily or as directed by your caregiver. You may then wash the wound gently with mild soapy water. Repack with gauze or do as your caregiver directs. SEEK IMMEDIATE MEDICAL CARE IF:   You develop increased pain, swelling, redness, drainage, or bleeding in the wound site.  You develop signs of generalized infection including muscle aches, chills, fever, or a general ill feeling.  An oral temperature above 102 F (38.9 C) develops, not controlled by medication. See your caregiver for a recheck if you develop any of the symptoms described above. If medications (antibiotics) were prescribed, take them as directed. Document Released: 11/19/2004 Document Revised: 07/26/2011 Document Reviewed: 07/17/2007 [facility] Patient Information 2015 [place], [place]. This information is not intended to replace advice given to you by your health care provider. Make sure you discuss any questions you have with your health care provider.

## 2014-11-21 ENCOUNTER — Emergency Department (HOSPITAL_BASED_OUTPATIENT_CLINIC_OR_DEPARTMENT_OTHER)
Admission: EM | Admit: 2014-11-21 | Discharge: 2014-11-21 | Disposition: A | Discharge status: Home / Self Care | Payer: Medicaid Other | Attending: Emergency Medicine | Admitting: Emergency Medicine

## 2014-11-21 ENCOUNTER — Encounter (HOSPITAL_BASED_OUTPATIENT_CLINIC_OR_DEPARTMENT_OTHER): Payer: Self-pay

## 2014-11-21 DIAGNOSIS — N939 Abnormal uterine and vaginal bleeding, unspecified: Secondary | ICD-10-CM | POA: Insufficient documentation

## 2014-11-21 DIAGNOSIS — Z9049 Acquired absence of other specified parts of digestive tract: Secondary | ICD-10-CM | POA: Insufficient documentation

## 2014-11-21 DIAGNOSIS — Z8619 Personal history of other infectious and parasitic diseases: Secondary | ICD-10-CM | POA: Insufficient documentation

## 2014-11-21 DIAGNOSIS — Z720 Tobacco use: Secondary | ICD-10-CM | POA: Insufficient documentation

## 2014-11-21 DIAGNOSIS — Z9851 Tubal ligation status: Secondary | ICD-10-CM | POA: Insufficient documentation

## 2014-11-21 DIAGNOSIS — Z880 Allergy status to penicillin: Secondary | ICD-10-CM | POA: Insufficient documentation

## 2014-11-21 DIAGNOSIS — Z3202 Encounter for pregnancy test, result negative: Secondary | ICD-10-CM | POA: Insufficient documentation

## 2014-11-21 LAB — CBC WITH DIFFERENTIAL/PLATELET
Basophils Absolute: 0.1 10*3/uL (ref 0.0–0.1)
Basophils Relative: 1 % (ref 0–1)
Eosinophils Absolute: 0.4 10*3/uL (ref 0.0–0.7)
Eosinophils Relative: 3 % (ref 0–5)
HEMATOCRIT: 35.5 % — AB (ref 36.0–46.0)
Hemoglobin: 11.3 g/dL — ABNORMAL LOW (ref 12.0–15.0)
Lymphocytes Relative: 37 % (ref 12–46)
Lymphs Abs: 4.4 10*3/uL — ABNORMAL HIGH (ref 0.7–4.0)
MCH: 25.4 pg — ABNORMAL LOW (ref 26.0–34.0)
MCHC: 31.8 g/dL (ref 30.0–36.0)
MCV: 79.8 fL (ref 78.0–100.0)
MONOS PCT: 6 % (ref 3–12)
Monocytes Absolute: 0.7 10*3/uL (ref 0.1–1.0)
NEUTROS ABS: 6.2 10*3/uL (ref 1.7–7.7)
NEUTROS PCT: 53 % (ref 43–77)
Platelets: 262 10*3/uL (ref 150–400)
RBC: 4.45 MIL/uL (ref 3.87–5.11)
RDW: 14.2 % (ref 11.5–15.5)
WBC: 11.7 10*3/uL — ABNORMAL HIGH (ref 4.0–10.5)

## 2014-11-21 LAB — WET PREP, GENITAL
CLUE CELLS WET PREP: NONE SEEN
TRICH WET PREP: NONE SEEN
Yeast Wet Prep HPF POC: NONE SEEN

## 2014-11-21 LAB — BASIC METABOLIC PANEL
ANION GAP: 7 (ref 5–15)
BUN: 10 mg/dL (ref 6–20)
CHLORIDE: 106 mmol/L (ref 101–111)
CO2: 27 mmol/L (ref 22–32)
Calcium: 8.9 mg/dL (ref 8.9–10.3)
Creatinine, Ser: 0.71 mg/dL (ref 0.44–1.00)
GFR calc non Af Amer: >60 mL/min (ref >60)
Glucose, Bld: 102 mg/dL — ABNORMAL HIGH (ref 65–99)
Potassium: 3.5 mmol/L (ref 3.5–5.1)
SODIUM: 140 mmol/L (ref 135–145)

## 2014-11-21 LAB — URINALYSIS, ROUTINE W REFLEX MICROSCOPIC
Bilirubin Urine: NEGATIVE
GLUCOSE, UA: NEGATIVE mg/dL
Ketones, ur: NEGATIVE mg/dL
NITRITE: NEGATIVE
PH: 6 (ref 5.0–8.0)
Protein, ur: NEGATIVE mg/dL
Specific Gravity, Urine: 1.019 (ref 1.005–1.030)
Urobilinogen, UA: 0.2 mg/dL (ref 0.0–1.0)

## 2014-11-21 LAB — URINE MICROSCOPIC-ADD ON

## 2014-11-21 LAB — PREGNANCY, URINE: PREG TEST UR: NEGATIVE

## 2014-11-21 MED ORDER — KETOROLAC TROMETHAMINE 30 MG/ML IJ SOLN
30.0000 mg | Freq: Once | INTRAMUSCULAR | Status: AC
  Administered 2014-11-21: 30 mg via INTRAVENOUS
  Filled 2014-11-21: qty 1

## 2014-11-21 MED ORDER — HYDROMORPHONE HCL 1 MG/ML IJ SOLN
0.5000 mg | Freq: Once | INTRAMUSCULAR | Status: AC
  Administered 2014-11-21: 0.5 mg via INTRAVENOUS
  Filled 2014-11-21: qty 1

## 2014-11-21 MED ORDER — HYDROCODONE-ACETAMINOPHEN 5-325 MG PO TABS: ORAL_TABLET | ORAL | Status: DC

## 2014-11-21 MED ORDER — IBUPROFEN 800 MG PO TABS: 800.0000 mg | ORAL_TABLET | Freq: Three times a day (TID) | ORAL | Status: DC

## 2014-11-21 NOTE — ED Provider Notes (Signed)
CSN: [id number]     Arrival date & time 11/21/14  1914 History   First MD Initiated Contact with Patient 11/21/14 1943     Chief Complaint  Patient presents with  . Vaginal Bleeding     (Consider location/radiation/quality/duration/timing/severity/associated sxs/prior Treatment) HPI Comments: Patient presents with acute onset of heavy vaginal bleeding, pelvic pain, lower abdominal cramping starting yesterday becoming worse. Patient used multiple tampons today. Patient took Portal powder and ibuprofen prior to arrival without relief. Last sexual contact was one month ago. + barrier protection. She scheduled appointment with her OB/GYN for 1 week. No past history of uterine fibroids. Patient has had tubal ligation performed. History of cesarean section. She has felt lightheaded while sitting but has not passed out. No shortness of breath. Onset of symptoms acute. Course is constant. Nothing makes symptoms better or worse.  Patient is a 34 y.o. female presenting with vaginal bleeding. The history is provided by the patient.  Vaginal Bleeding Associated symptoms: nausea   Associated symptoms: no abdominal pain, no dysuria and no fever     Past Medical History  Diagnosis Date  . Hepatitis C   . Hepatitis C    Past Surgical History  Procedure Laterality Date  . Cesarean section    . Cholecystectomy    . Tubal ligation     No family history on file. History  Substance Use Topics  . Smoking status: Current Every Day Smoker -- 0.75 packs/day    Types: Cigarettes  . Smokeless tobacco: Never Used  . Alcohol Use: No     Comment: rarely   OB History    No data available     Review of Systems  Constitutional: Negative for fever.  HENT: Negative for rhinorrhea and sore throat.   Eyes: Negative for redness.  Respiratory: Negative for cough and shortness of breath.   Cardiovascular: Negative for chest pain.  Gastrointestinal: Positive for nausea. Negative for vomiting, abdominal pain and  diarrhea.  Genitourinary: Positive for vaginal bleeding and pelvic pain. Negative for dysuria and flank pain.  Musculoskeletal: Negative for myalgias.  Skin: Negative for rash.  Neurological: Positive for light-headedness. Negative for headaches.      Allergies  Morphine and related and Penicillins  Home Medications   Prior to Admission medications   Medication Sig Start Date End Date Taking? Authorizing Provider  cyclobenzaprine (FLEXERIL) 5 MG tablet Take 1 tablet (5 mg total) by mouth 3 (three) times daily as needed for muscle spasms. 04/02/14   Hope Orlene Och, NP  HYDROcodone-acetaminophen (NORCO/VICODIN) 5-325 MG per tablet Take 1 tablet by mouth every 6 (six) hours as needed. 04/02/14   Hope Orlene Och, NP  ibuprofen (ADVIL,MOTRIN) 600 MG tablet Take 1 tablet (600 mg total) by mouth every 6 (six) hours as needed. 04/02/14   Hope Orlene Och, NP  oxyCODONE-acetaminophen (PERCOCET) 5-325 MG per tablet Take 1-2 tablets by mouth every 6 (six) hours as needed for severe pain. 08/25/14   Robyn M Hess, PA-C   BP 125/82 mmHg  Pulse 84  Temp(Src) 98.3 F (36.8 C) (Oral)  Resp 18  Ht  (1.702 m)  Wt 225 lb 12.8 oz (102.422 kg)  BMI 35.36 kg/m2  SpO2 100%  LMP 11/20/2014   Physical Exam  Constitutional: She appears well-developed and well-nourished.  HENT:  Head: Normocephalic and atraumatic.  Eyes: Conjunctivae are normal. Right eye exhibits no discharge. Left eye exhibits no discharge.  Neck: Normal range of motion. Neck supple.  Cardiovascular: Normal rate, regular  rhythm and normal heart sounds.   Pulmonary/Chest: Effort normal and breath sounds normal.  Abdominal: Soft. Bowel sounds are normal. She exhibits no distension. There is tenderness in the suprapubic area. There is no rebound and no guarding.  Genitourinary: Uterus is tender. Uterus is not deviated, not enlarged and not fixed. Cervix exhibits discharge (blood). Cervix exhibits no motion tenderness and no friability. Right  adnexum displays tenderness. Right adnexum displays no mass and no fullness. Left adnexum displays tenderness. Left adnexum displays no mass and no fullness. There is bleeding in the vagina. No erythema or tenderness in the vagina. No foreign body around the vagina. No signs of injury around the vagina.  Neurological: She is alert.  Skin: Skin is warm and dry.  Psychiatric: She has a normal mood and affect.  Nursing note and vitals reviewed.   ED Course  Procedures (including critical care time) Labs Review Labs Reviewed  WET PREP, GENITAL - Abnormal; Notable for the following:    WBC, Wet Prep HPF POC FEW (*)    All other components within normal limits  URINALYSIS, ROUTINE W REFLEX MICROSCOPIC (NOT AT [facility]) - Abnormal; Notable for the following:    Color, Urine AMBER (*)    APPearance CLOUDY (*)    Hgb urine dipstick LARGE (*)    Leukocytes, UA SMALL (*)    All other components within normal limits  URINE MICROSCOPIC-ADD ON - Abnormal; Notable for the following:    Squamous Epithelial / LPF FEW (*)    Bacteria, UA FEW (*)    All other components within normal limits  CBC WITH DIFFERENTIAL/PLATELET - Abnormal; Notable for the following:    WBC 11.7 (*)    Hemoglobin 11.3 (*)    HCT 35.5 (*)    MCH 25.4 (*)    Lymphs Abs 4.4 (*)    All other components within normal limits  BASIC METABOLIC PANEL - Abnormal; Notable for the following:    Glucose, Bld 102 (*)    All other components within normal limits  PREGNANCY, URINE  GC/CHLAMYDIA PROBE AMP ([place]) NOT AT [facility]    Imaging Review No results found.   EKG Interpretation None       8:46 PM Patient seen and examined. Work-up initiated.    Vital signs reviewed and are as follows: BP 125/82 mmHg  Pulse 84  Temp(Src) 98.3 F (36.8 C) (Oral)  Resp 18  Ht 5\' 7"  (1.702 m)  Wt 225 lb 12.8 oz (102.422 kg)  BMI 35.36 kg/m2  SpO2 100%  LMP 11/20/2014  10:18 PM Pelvic exam performed by Delora Fuel PA-S2 under my direct  supervision, RN Windell Moulding chaperone.   Pt updated on results, including hgb. She will continue to monitor bleeding. She is instructed to return with worsening lightheadedness, worsening bleeding, fatigue, syncope. Otherwise follow-up with her GYN with whom she already has an appointment in one week. Discharged home with NSAIDs and pain medication.  Patient counseled on use of narcotic pain medications. Counseled not to combine these medications with others containing tylenol. Urged not to drink alcohol, drive, or perform any other activities that requires focus while taking these medications. The patient verbalizes understanding and agrees with the plan.   MDM   Final diagnoses:  Vaginal bleeding   Patient with vaginal bleeding, likely menstrual bleeding. Bleeding is heavier than normal for patient. She reports some lightheadedness but her hemoglobin is 11.3. She is neither tachycardic nor hypotensive. Moderate amount of bleeding on exam. Patient has some uterine  tenderness. No history findings or symptoms concerning for PID. Pregnancy test is negative. At this point, will treat patient's symptoms. She has scheduled a GYN appointment in 1 week. Discussed signs and symptoms that should cause her to return. Patient appears well.    Renne CriglerJoshua Zandyr Barnhill, PA-C 11/21/14 2221  Pricilla LovelessScott Goldston, MD 11/24/14 [phone]

## 2014-11-21 NOTE — ED Notes (Signed)
Pt reports bilateral pelvic pain, vaginal bleeding in large amount, states it is time for her period but was late.  Feels like she was bloated.

## 2014-11-21 NOTE — Discharge Instructions (Signed)
Please read and follow all provided instructions.  Your diagnoses today include:  1. Vaginal bleeding    Tests performed today include:  Blood counts and electrolytes - mildly low blood counts  Blood tests to check kidney function  Urine test to look for infection and pregnancy (in women)  Vital signs. See below for your results today.   Medications prescribed:   Ibuprofen (Motrin, Advil) - anti-inflammatory pain medication  Do not exceed 600mg  ibuprofen every 6 hours, take with food  You have been prescribed an anti-inflammatory medication or NSAID. Take with food. Take smallest effective dose for the shortest duration needed for your pain. Stop taking if you experience stomach pain or vomiting.    Vicodin (hydrocodone/acetaminophen) - narcotic pain medication  DO NOT drive or perform any activities that require you to be awake and alert because this medicine can make you drowsy. BE VERY CAREFUL not to take multiple medicines containing Tylenol (also called acetaminophen). Doing so can lead to an overdose which can damage your liver and cause liver failure and possibly death.  Take any prescribed medications only as directed.  Home care instructions:   Follow any educational materials contained in this packet.  Follow-up instructions: Please follow-up with your GYN in the next 7 days for further evaluation of your symptoms.    Return instructions:  SEEK IMMEDIATE MEDICAL ATTENTION IF:  The pain does not go away or becomes severe   A temperature above 101F develops   Repeated vomiting occurs (multiple episodes)   The pain becomes localized to portions of the abdomen. The right side could possibly be appendicitis. In an adult, the left lower portion of the abdomen could be colitis or diverticulitis.   Blood is being passed in stools or vomit (bright red or black tarry stools)   You develop chest pain, difficulty breathing, dizziness or fainting, or become confused,  poorly responsive, or inconsolable (young children)  If you have any other emergent concerns regarding your health  Additional Information: Abdominal (belly) pain can be caused by many things. Your caregiver performed an examination and possibly ordered blood/urine tests and imaging (CT scan, x-rays, ultrasound). Many cases can be observed and treated at home after initial evaluation in the emergency department. Even though you are being discharged home, abdominal pain can be unpredictable. Therefore, you need a repeated exam if your pain does not resolve, returns, or worsens. Most patients with abdominal pain don't have to be admitted to the hospital or have surgery, but serious problems like appendicitis and gallbladder attacks can start out as nonspecific pain. Many abdominal conditions cannot be diagnosed in one visit, so follow-up evaluations are very important.  Your vital signs today were: BP 166/104 mmHg   Pulse 74   Temp(Src) 98.3 F (36.8 C) (Oral)   Resp 18   Ht 5\' 7"  (1.702 m)   Wt 225 lb 12.8 oz (102.422 kg)   BMI 35.36 kg/m2   SpO2 100%   LMP 11/20/2014 If your blood pressure (bp) was elevated above 135/85 this visit, please have this repeated by your doctor within one month. --------------

## 2014-11-22 LAB — GC/CHLAMYDIA PROBE AMP (~~LOC~~) NOT AT ARMC
Chlamydia: NEGATIVE
NEISSERIA GONORRHEA: NEGATIVE

## 2015-06-16 ENCOUNTER — Emergency Department (HOSPITAL_BASED_OUTPATIENT_CLINIC_OR_DEPARTMENT_OTHER)
Admission: EM | Admit: 2015-06-16 | Discharge: 2015-06-16 | Disposition: A | Discharge status: Home / Self Care | Payer: Medicaid Other | Attending: Emergency Medicine | Admitting: Emergency Medicine

## 2015-06-16 ENCOUNTER — Encounter (HOSPITAL_BASED_OUTPATIENT_CLINIC_OR_DEPARTMENT_OTHER): Payer: Self-pay | Admitting: *Deleted

## 2015-06-16 DIAGNOSIS — Z9889 Other specified postprocedural states: Secondary | ICD-10-CM | POA: Insufficient documentation

## 2015-06-16 DIAGNOSIS — L03311 Cellulitis of abdominal wall: Secondary | ICD-10-CM | POA: Insufficient documentation

## 2015-06-16 DIAGNOSIS — Z791 Long term (current) use of non-steroidal anti-inflammatories (NSAID): Secondary | ICD-10-CM | POA: Insufficient documentation

## 2015-06-16 DIAGNOSIS — J029 Acute pharyngitis, unspecified: Secondary | ICD-10-CM | POA: Insufficient documentation

## 2015-06-16 DIAGNOSIS — L0390 Cellulitis, unspecified: Secondary | ICD-10-CM

## 2015-06-16 DIAGNOSIS — Z8719 Personal history of other diseases of the digestive system: Secondary | ICD-10-CM | POA: Insufficient documentation

## 2015-06-16 DIAGNOSIS — Z880 Allergy status to penicillin: Secondary | ICD-10-CM | POA: Insufficient documentation

## 2015-06-16 LAB — RAPID STREP SCREEN (MED CTR MEBANE ONLY): Streptococcus, Group A Screen (Direct): NEGATIVE

## 2015-06-16 MED ORDER — CEPHALEXIN 500 MG PO CAPS: 500.0000 mg | ORAL_CAPSULE | Freq: Four times a day (QID) | ORAL | Status: DC

## 2015-06-16 NOTE — Discharge Instructions (Signed)
Keflex as prescribed.  Ibuprofen 600 mg every 6 hours as needed for pain.   Pharyngitis Pharyngitis is redness, pain, and swelling (inflammation) of your pharynx.  CAUSES  Pharyngitis is usually caused by infection. Most of the time, these infections are from viruses (viral) and are part of a cold. However, sometimes pharyngitis is caused by bacteria (bacterial). Pharyngitis can also be caused by allergies. Viral pharyngitis may be spread from person to person by coughing, sneezing, and personal items or utensils (cups, forks, spoons, toothbrushes). Bacterial pharyngitis may be spread from person to person by more intimate contact, such as kissing.  SIGNS AND SYMPTOMS  Symptoms of pharyngitis include:   Sore throat.   Tiredness (fatigue).   Low-grade fever.   Headache.  Joint pain and muscle aches.  Skin rashes.  Swollen lymph nodes.  Plaque-like film on throat or tonsils (often seen with bacterial pharyngitis). DIAGNOSIS  Your health care provider will ask you questions about your illness and your symptoms. Your medical history, along with a physical exam, is often all that is needed to diagnose pharyngitis. Sometimes, a rapid strep test is done. Other lab tests may also be done, depending on the suspected cause.  TREATMENT  Viral pharyngitis will usually get better in 3-4 days without the use of medicine. Bacterial pharyngitis is treated with medicines that kill germs (antibiotics).  HOME CARE INSTRUCTIONS   Drink enough water and fluids to keep your urine clear or pale yellow.   Only take over-the-counter or prescription medicines as directed by your health care provider:   If you are prescribed antibiotics, make sure you finish them even if you start to feel better.   Do not take aspirin.   Get lots of rest.   Gargle with 8 oz of salt water ( tsp of salt per 1 qt of water) as often as every 1-2 hours to soothe your throat.   Throat lozenges (if you are not  at risk for choking) or sprays may be used to soothe your throat. SEEK MEDICAL CARE IF:   You have large, tender lumps in your neck.  You have a rash.  You cough up green, yellow-brown, or bloody spit. SEEK IMMEDIATE MEDICAL CARE IF:   Your neck becomes stiff.  You drool or are unable to swallow liquids.  You vomit or are unable to keep medicines or liquids down.  You have severe pain that does not go away with the use of recommended medicines.  You have trouble breathing (not caused by a stuffy nose). MAKE SURE YOU:   Understand these instructions.  Will watch your condition.  Will get help right away if you are not doing well or get worse.   This information is not intended to replace advice given to you by your health care provider. Make sure you discuss any questions you have with your health care provider.   Document Released: 05/03/2005 Document Revised: 02/21/2013 Document Reviewed: 01/08/2013 Else[employer] Interactive Patient Education [employer]

## 2015-06-16 NOTE — ED Notes (Signed)
Pt complaining of sore throat for over a week. Pt also complaining of oozing from her c-section scar which was in March 2015.

## 2015-06-16 NOTE — ED Provider Notes (Signed)
CSN: [id number]     Arrival date & time 06/16/15  1732 History  By signing my name below, I, Michele Walker, attest that this documentation has been prepared under the direction and in the presence of Geoffery Lyons, MD. Electronically Signed: Tanda Walker, ED Scribe. 06/16/2015. 7:00 PM.    Chief Complaint  Patient presents with  . Sore Throat   Patient is a 35 y.o. female presenting with pharyngitis. The history is provided by the patient. No language interpreter was used.  Sore Throat This is a new problem. The current episode started more than 2 days ago. The problem occurs rarely. The problem has not changed since onset.Pertinent negatives include no chest pain, no abdominal pain, no headaches and no shortness of breath. The symptoms are aggravated by swallowing, eating and drinking.     HPI Comments: Michele Walker is a 35 y.o. female who presents to the Emergency Department complaining of gradual onset, constant, sore throat x 1 week. Pt also complains of productive cough with yellow phlegm and black/red flecks, congestion, and a subjective fever. Recent sick contact with similar symptoms. Denies chest pain or any other associated symptoms.   Pt also complains of pain and cloudy/yellow foul smelling drainage from her cesarean section scar that has been ongoing since March 2015 (2 years ago). She mentions that she saw her surgeon after it began draining and was told it was a normal process. The drainage has increased 2 weeks ago.    Past Medical History  Diagnosis Date  . Hepatitis C   . Hepatitis C    Past Surgical History  Procedure Laterality Date  . Cesarean section    . Cholecystectomy    . Tubal ligation     No family history on file. Social History  Substance Use Topics  . Smoking status: Current Every Day Smoker -- 0.75 packs/day    Types: Cigarettes  . Smokeless tobacco: Never Used  . Alcohol Use: No     Comment: rarely   OB History    No data available      Review of Systems  Respiratory: Negative for shortness of breath.   Cardiovascular: Negative for chest pain.  Gastrointestinal: Negative for abdominal pain.  Neurological: Negative for headaches.    A complete 10 system review of systems was obtained and all systems are negative except as noted in the HPI and PMH.   Allergies  Morphine and related and Penicillins  Home Medications   Prior to Admission medications   Medication Sig Start Date End Date Taking? Authorizing Provider  HYDROcodone-acetaminophen (NORCO/VICODIN) 5-325 MG per tablet Take 1-2 tablets every 6 hours as needed for severe pain 11/21/14   Renne Crigler, PA-C  ibuprofen (ADVIL,MOTRIN) 800 MG tablet Take 1 tablet (800 mg total) by mouth 3 (three) times daily. 11/21/14   Renne Crigler, PA-C   BP 133/88 mmHg  Pulse 88  Temp(Src) 98.2 F (36.8 C) (Oral)  Resp 18  Ht  (1.727 m)  SpO2 100%  LMP 05/16/2015   Physical Exam  Constitutional: She is oriented to person, place, and time. She appears well-developed and well-nourished. No distress.  HENT:  Head: Normocephalic and atraumatic.  Mouth/Throat: Mucous membranes are normal. No uvula swelling. Posterior oropharyngeal erythema present. No oropharyngeal exudate or tonsillar abscesses.  Eyes: Conjunctivae and EOM are normal. Pupils are equal, round, and reactive to light.  Neck: Normal range of motion. Neck supple.  No meningismus.  Cardiovascular: Normal rate, regular rhythm, normal heart sounds and  intact distal pulses.   No murmur heard. Pulmonary/Chest: Effort normal and breath sounds normal. No respiratory distress.  Abdominal: Soft. There is no tenderness. There is no rebound and no guarding.  Musculoskeletal: Normal range of motion. She exhibits no edema or tenderness.  Lymphadenopathy:    She has no cervical adenopathy.  Neurological: She is alert and oriented to person, place, and time. No cranial nerve deficit. She exhibits normal muscle tone.  Coordination normal.  No ataxia on finger to nose bilaterally. No pronator drift. 5/5 strength throughout. CN 2-12 intact.Equal grip strength. Sensation intact.   Skin: Skin is warm.  There is a suprapubic incision noted,  There is a small area of erythema with serous drainage to the left side of the incision.  There is mild tenderness in this area  Psychiatric: She has a normal mood and affect. Her behavior is normal.  Nursing note and vitals reviewed.   ED Course  Procedures (including critical care time)  DIAGNOSTIC STUDIES: Oxygen Saturation is 100% on RA, normal by my interpretation.    COORDINATION OF CARE: 6:57 PM-Discussed treatment plan which includes Rx antibiotics with pt at bedside and pt agreed to plan.   Labs Review Labs Reviewed  RAPID STREP SCREEN (NOT AT [facility])  CULTURE, GROUP A STREP [facility])    Imaging Review No results found. I have personally reviewed and evaluated these lab results as part of my medical decision-making.   EKG Interpretation None      MDM   Final diagnoses:  None    Strep test negative. Symptoms are likely viral in nature.  She does also report drainage from her C-section scar. The drainage has increased in quantity and is now cloudy in color. This will be treated with Keflex and follow-up with her GYN.   I personally performed the services described in this documentation, which was scribed in my presence. The recorded information has been reviewed and is accurate.       Geoffery Lyons, MD 06/16/15 [phone]

## 2015-06-19 LAB — CULTURE, GROUP A STREP (THRC)

## 2015-12-02 ENCOUNTER — Encounter (HOSPITAL_BASED_OUTPATIENT_CLINIC_OR_DEPARTMENT_OTHER): Payer: Self-pay | Admitting: *Deleted

## 2015-12-02 ENCOUNTER — Emergency Department (HOSPITAL_BASED_OUTPATIENT_CLINIC_OR_DEPARTMENT_OTHER)
Admission: EM | Admit: 2015-12-02 | Discharge: 2015-12-02 | Disposition: A | Discharge status: Home / Self Care | Payer: BLUE CROSS/BLUE SHIELD | Attending: Emergency Medicine | Admitting: Emergency Medicine

## 2015-12-02 DIAGNOSIS — E669 Obesity, unspecified: Secondary | ICD-10-CM | POA: Diagnosis not present

## 2015-12-02 DIAGNOSIS — F17210 Nicotine dependence, cigarettes, uncomplicated: Secondary | ICD-10-CM | POA: Diagnosis not present

## 2015-12-02 DIAGNOSIS — R209 Unspecified disturbances of skin sensation: Secondary | ICD-10-CM | POA: Diagnosis not present

## 2015-12-02 DIAGNOSIS — Z6836 Body mass index (BMI) 36.0-36.9, adult: Secondary | ICD-10-CM | POA: Diagnosis not present

## 2015-12-02 DIAGNOSIS — M79651 Pain in right thigh: Secondary | ICD-10-CM | POA: Diagnosis present

## 2015-12-02 DIAGNOSIS — R202 Paresthesia of skin: Secondary | ICD-10-CM

## 2015-12-02 LAB — D-DIMER, QUANTITATIVE (NOT AT ARMC): D DIMER QUANT: 0.29 ug{FEU}/mL (ref 0.00–0.50)

## 2015-12-02 NOTE — ED Provider Notes (Signed)
CSN: [id number]     Arrival date & time 12/02/15  1732 History  By signing my name below, I, Placido SouLogan Joldersma, attest that this documentation has been prepared under the direction and in the presence of Doug SouSam Anamae Rochelle, MD. Electronically Signed: Placido SouLogan Joldersma, ED Scribe. 12/02/2015. 6:11 PM.   Chief Complaint  Patient presents with  . Leg Pain   The history is provided by the patient. No language interpreter was used.    HPI Comments: Michele Walker is a 35 y.o. female with a PMHx of hepatitis C and bells palsy who presents to the Emergency Department complaining of gradual onset, moderate, rightAnterior thigh pain onset this morning.  Pt states she got into the shower this morning and was experiencing diffuse pain and paraesthesias in her right thigh. She then went to a prior scheduled appointment with her GI doctor, for routine evaluation which she states she was "anxious about" and began noticing that her pain and paraesthesias were moving down her right leg into her right calf. She reports associated numbness in the toes of her right foot. Pt reports a hx of multiple cysts near her groin which have been present for years and intermittently drain when applying pressure. She states that she sits on her legs regularly at work which cause them to fall asleep. Incidentally, she reports that she ran into a piece of furniture with her right anterior thigh a few days ago. Pt denies having been treated for her hepatitis C which she was dx with three years ago due to being pregnant at the time. She confirms having an OBGYN which she sees regularly. Nothing makes symptoms better or worse. She reports a hx of smoking both cigarettes and marijuana and denies any other illegal narcotic use. She reports driving to [place] on July 4th. Pt denies being on any birth control medications. Pt denies back pain, fever, bowel or bladder incontinence, abd pain and n/v/d. She is concerned about possible blood clot since "I'm a  smoker" discomfort is mild at present and nothing makes symptoms better or worse. She's had no loss of balance and no difficulty walking. Pain is mild at present  Past Medical History  Diagnosis Date  . Hepatitis C   . Hepatitis C    Past Surgical History  Procedure Laterality Date  . Cesarean section    . Cholecystectomy    . Tubal ligation     No family history on file. Social History  Substance Use Topics  . Smoking status: Current Every Day Smoker -- 0.75 packs/day    Types: Cigarettes  . Smokeless tobacco: Never Used  . Alcohol Use: No     Comment: rarely  Marijuana use OB History    No data available     Review of Systems  Musculoskeletal: Positive for myalgias.       Pain in right anterior thigh and right  Neurological: Positive for numbness.  All other systems reviewed and are negative.   Allergies  Morphine and related and Penicillins  Home Medications   Prior to Admission medications   Medication Sig Start Date End Date Taking? Authorizing Provider  cephALEXin (KEFLEX) 500 MG capsule Take 1 capsule (500 mg total) by mouth 4 (four) times daily. 06/16/15   Geoffery Lyonsouglas Delo, MD  HYDROcodone-acetaminophen (NORCO/VICODIN) 5-325 MG per tablet Take 1-2 tablets every 6 hours as needed for severe pain 11/21/14   Renne CriglerJoshua Geiple, PA-C  ibuprofen (ADVIL,MOTRIN) 800 MG tablet Take 1 tablet (800 mg total) by mouth 3 (three)  times daily. 11/21/14   Renne Crigler, PA-C   BP 124/71 mmHg  Pulse 94  Temp(Src) 98.1 F (36.7 C) (Oral)  Resp 20  Ht 5' 7.5" (1.715 m)  Wt 237 lb (107.502 kg)  BMI 36.55 kg/m2  SpO2 100%  LMP 11/25/2015 Physical Exam  Constitutional: She is oriented to person, place, and time. She appears well-developed and well-nourished. No distress.  HENT:  Head: Normocephalic and atraumatic.  Eyes: Conjunctivae are normal. Pupils are equal, round, and reactive to light.  Neck: Neck supple. No tracheal deviation present. No thyromegaly present.  Cardiovascular:  Normal rate and regular rhythm.   No murmur heard. Pulmonary/Chest: Effort normal and breath sounds normal.  Abdominal: Soft. Bowel sounds are normal. She exhibits no distension. There is no tenderness.  Obese  Musculoskeletal: Normal range of motion. She exhibits no edema or tenderness.  Right lower extremity no redness no swelling or tenderness DP pulses and PT pulses 2+ bilaterally  Neurological: She is alert and oriented to person, place, and time. Coordination normal.  Gait normal  Skin: Skin is warm and dry. No rash noted.  Psychiatric: She has a normal mood and affect.  Nursing note and vitals reviewed.   ED Course  Procedures  DIAGNOSTIC STUDIES: Oxygen Saturation is 100% on RA, normal by my interpretation.    COORDINATION OF CARE: 6:08 PM Discussed next steps with pt. Pt verbalized understanding and is agreeable with the plan.   Labs Review Labs Reviewed - No data to display  Imaging Review No results found. I have personally reviewed and evaluated these images and lab results as part of my medical decision-making.   EKG Interpretation None      Results for orders placed or performed during the hospital encounter of 12/02/15  D-dimer, quantitative (not at [facility])  Result Value Ref Range   D-Dimer, Quant 0.29 0.00 - 0.50 ug/mL-FEU   No results found.  Declines pain medicine MDM  Low pretest clinical probability for DVT, negative d-dimer. No signs of infection or vascular compromise. Plan patient referred to neurologist she seen in the past for Bell's palsy. She was also counseled for 5 minutes on smoking cessation Diagnoses #1 right leg pain #2 paresthesias #3 tobacco abuse Final diagnoses:  None      I personally performed the services described in this documentation, which was scribed in my presence. The recorded information has been reviewed and considered.    Doug Sou, MD 12/02/15 [phone]

## 2015-12-02 NOTE — Discharge Instructions (Signed)
°  Paresthesia Call your neurologist if numbness and pain continues to schedule an office appointment. You may need further diagnostic testing that we could not perform here. Ask your OB/GYN doctor to help you to stop smoking. Paresthesia is a burning or prickling feeling. This feeling can happen in any part of the body. It often happens in the hands, arms, legs, or feet. Usually, it is not painful. In most cases, the feeling goes away in a short time and is not a sign of a serious problem. HOME CARE  Avoid drinking alcohol.  Try massage or needle therapy (acupuncture) to help with your problems.  Keep all follow-up visits as told by your doctor. This is important. GET HELP IF:  You keep on having episodes of paresthesia.  Your burning or prickling feeling gets worse when you walk.  You have pain or cramps.  You feel dizzy.  You have a rash. GET HELP RIGHT AWAY IF:  You feel weak.  You have trouble walking or moving.  You have problems speaking, understanding, or seeing.  You feel confused.  You cannot control when you pee (urinate) or poop (bowel movement).  You lose feeling (numbness) after an injury.  You pass out (faint).   This information is not intended to replace advice given to you by your health care provider. Make sure you discuss any questions you have with your health care provider.   Document Released: 04/15/2008 Document Revised: 09/17/2014 Document Reviewed: 04/29/2014 Elsevier Interactive Patient Education [employer]2016 [employer] Inc.

## 2015-12-02 NOTE — ED Notes (Signed)
MD at bedside to review blood test results.

## 2015-12-02 NOTE — ED Notes (Signed)
MD at bedside. 

## 2015-12-02 NOTE — ED Notes (Signed)
Patient reports that she woke up with right leg pain this morning. Patient noticed while showering that her right thigh was hurting. Patient states she dismissed it and went about her routine. Patient states later in the day she noticed that her right leg was feeling heavy and tingling. Patient states the pain moved to her calf, and has now moved back into her thigh. Patient reports she is a smoker and also had recent car travel to [place] around July 4th. Patient also reports she has cysts to her inner things, she has had them intermittently for years, and hx of Hep C without treatment. Patient denies difficulty walking, was seen ambulating into the treatment area unassisted. Patient states pain is 3-4/10. Patient denies difference of sensation to her right leg, but has + sensation to touch equal left to right. Patient denies back pain, denies urinary or bowel issues.

## 2015-12-02 NOTE — ED Notes (Signed)
Woke with numbness in her right leg with sharp shooting pain with radiation into her knee. This evening it has moved to her lower leg. Equal temp to touch. Ambulatory. Denies SOB. She is anxious.

## 2019-04-01 ENCOUNTER — Ambulatory Visit (HOSPITAL_COMMUNITY)
Admission: EM | Admit: 2019-04-01 | Discharge: 2019-04-01 | Disposition: A | Discharge status: Home / Self Care | Payer: BLUE CROSS/BLUE SHIELD | Attending: Emergency Medicine | Admitting: Emergency Medicine

## 2019-04-01 ENCOUNTER — Ambulatory Visit (INDEPENDENT_AMBULATORY_CARE_PROVIDER_SITE_OTHER): Payer: BLUE CROSS/BLUE SHIELD

## 2019-04-01 ENCOUNTER — Ambulatory Visit (HOSPITAL_COMMUNITY): Payer: BLUE CROSS/BLUE SHIELD

## 2019-04-01 ENCOUNTER — Other Ambulatory Visit: Payer: Self-pay

## 2019-04-01 ENCOUNTER — Encounter (HOSPITAL_COMMUNITY): Payer: Self-pay | Admitting: *Deleted

## 2019-04-01 DIAGNOSIS — M25571 Pain in right ankle and joints of right foot: Secondary | ICD-10-CM

## 2019-04-01 DIAGNOSIS — W108XXA Fall (on) (from) other stairs and steps, initial encounter: Secondary | ICD-10-CM | POA: Diagnosis not present

## 2019-04-01 DIAGNOSIS — S93401A Sprain of unspecified ligament of right ankle, initial encounter: Secondary | ICD-10-CM

## 2019-04-01 DIAGNOSIS — S93602A Unspecified sprain of left foot, initial encounter: Secondary | ICD-10-CM

## 2019-04-01 DIAGNOSIS — M25572 Pain in left ankle and joints of left foot: Secondary | ICD-10-CM

## 2019-04-01 MED ORDER — IBUPROFEN 800 MG PO TABS: 800.0000 mg | ORAL_TABLET | Freq: Three times a day (TID) | ORAL | 0 refills | Status: DC

## 2019-04-01 NOTE — Discharge Instructions (Signed)
Use anti-inflammatories for pain/swelling. You may take up to 800 mg Ibuprofen every 8 hours with food. You may supplement Ibuprofen with Tylenol [phone] mg every 8 hours.  Ice and elevate Weight bear as tolerated Follow up if not resolving or worsening

## 2019-04-01 NOTE — ED Provider Notes (Signed)
[facility]    CSN: [id number] Arrival date & time: 04/01/19  1245      History   Chief Complaint Chief Complaint  Patient presents with  . Appointment    1320  . Foot Injury    HPI Michele Walker is a 38 y.o. female no contributing past medical history presenting today for evaluation of foot injury.  Patient states that she missed a step approximately an hour ago and fell.  She felt popping in her right ankle and foot.  Since her fall she has mainly noted pain to her right ankle as well as her left foot.  Has been ambulating with limp due to pain.  She took Excedrin.  Denies previous fractures in feet.   HPI  Past Medical History:  Diagnosis Date  . Hepatitis C   . Hepatitis C     There are no active problems to display for this patient.   Past Surgical History:  Procedure Laterality Date  . CESAREAN SECTION    . CHOLECYSTECTOMY    . TUBAL LIGATION      OB History   No obstetric history on file.      Home Medications    Prior to Admission medications   Medication Sig Start Date End Date Taking? Authorizing Provider  cephALEXin (KEFLEX) 500 MG capsule Take 1 capsule (500 mg total) by mouth 4 (four) times daily. 06/16/15   Geoffery Lyons, MD  HYDROcodone-acetaminophen (NORCO/VICODIN) 5-325 MG per tablet Take 1-2 tablets every 6 hours as needed for severe pain 11/21/14   Renne Crigler, PA-C  ibuprofen (ADVIL) 800 MG tablet Take 1 tablet (800 mg total) by mouth 3 (three) times daily. 04/01/19   ,  C, PA-C  ferrous sulfate 325 (65 FE) MG tablet Take 325 mg by mouth daily with breakfast.  04/02/14  [provider]  metoCLOPramide (REGLAN) 10 MG tablet Take 1 tablet (10 mg total) by mouth every 6 (six) hours. 08/25/13 04/02/14  MabeLatanya Maudlin, MD    Family History Family History  Problem Relation Age of Onset  . Healthy Mother   . Healthy Father     Social History Social History   Tobacco Use  . Smoking status: Current Every  Day Smoker    Packs/day: 0.75    Types: Cigarettes  . Smokeless tobacco: Never Used  Substance Use Topics  . Alcohol use: No  . Drug use: Yes    Types: Marijuana     Allergies   Morphine and related and Penicillins   Review of Systems Review of Systems  Constitutional: Negative for fatigue and fever.  Eyes: Negative for visual disturbance.  Respiratory: Negative for shortness of breath.   Cardiovascular: Negative for chest pain.  Gastrointestinal: Negative for abdominal pain, nausea and vomiting.  Musculoskeletal: Positive for arthralgias, gait problem and joint swelling.  Skin: Negative for color change, rash and wound.  Neurological: Negative for dizziness, weakness, light-headedness and headaches.     Physical Exam Triage Vital Signs ED Triage Vitals  Enc Vitals Group     BP 04/01/19 1323 129/86     Pulse Rate 04/01/19 1323 (!) 110     Resp 04/01/19 1323 18     Temp 04/01/19 1323 98.9 F (37.2 C)     Temp Source 04/01/19 1323 Oral     SpO2 04/01/19 1323 99 %     Weight --      Height --      Head Circumference --  Peak Flow --      Pain Score 04/01/19 1322 7     Pain Loc --      Pain Edu? --      Excl. in GC? --    No data found.  Updated Vital Signs BP 129/86   Pulse (!) 110   Temp 98.9 F (37.2 C) (Oral)   Resp 18   LMP 03/18/2019 (Approximate)   SpO2 99%   Visual Acuity Right Eye Distance:   Left Eye Distance:   Bilateral Distance:    Right Eye Near:   Left Eye Near:    Bilateral Near:     Physical Exam Vitals signs and nursing note reviewed.  Constitutional:      General: She is not in acute distress.    Appearance: She is well-developed.  HENT:     Head: Normocephalic and atraumatic.  Eyes:     Conjunctiva/sclera: Conjunctivae normal.  Neck:     Musculoskeletal: Neck supple.  Cardiovascular:     Rate and Rhythm: Normal rate and regular rhythm.     Heart sounds: No murmur.  Pulmonary:     Effort: Pulmonary effort is normal.  No respiratory distress.     Breath sounds: Normal breath sounds.  Abdominal:     Palpations: Abdomen is soft.     Tenderness: There is no abdominal tenderness.  Musculoskeletal:     Comments: Right ankle/foot: No obvious deformity or discoloration, mild swelling noted to lateral malleolus, tender to palpation over anterior lateral malleolus extending anteriorly and into proximal lateral dorsum of foot, nontender to distal metatarsals, dorsalis pedis 2+  Left foot/ankle: No obvious deformity or discoloration, mild swelling noted to distal metatarsals, nontender to palpation over medial and lateral malleolus, nontender over anterior ankle and proximal dorsum of foot, tenderness below first and second toes at distal first and second metatarsals Dorsalis pedis 2+  Gait with antalgia  Skin:    General: Skin is warm and dry.  Neurological:     Mental Status: She is alert.      UC Treatments / Results  Labs (all labs ordered are listed, but only abnormal results are displayed) Labs Reviewed - No data to display  EKG   Radiology Dg Ankle Complete Right  Result Date: 04/01/2019 CLINICAL DATA:  Fall, right ankle pain. EXAM: RIGHT ANKLE - COMPLETE 3+ VIEW COMPARISON:  None. FINDINGS: Plantar and Achilles calcaneal spurs on the right. No acute bony findings or significant soft tissue abnormality. IMPRESSION: Plantar and Achilles calcaneal spurs. Otherwise, no significant abnormalities are observed. Electronically Signed   By: Gaylyn RongWalter  Liebkemann M.D.   On: 04/01/2019 14:43   Dg Foot Complete Left  Result Date: 04/01/2019 CLINICAL DATA:  Fall, left dorsal foot pain and right lateral ankle pain. EXAM: LEFT FOOT - COMPLETE 3+ VIEW COMPARISON:  Left ankle radiographs from 08/20/2012 FINDINGS: Lisfranc joint alignment normal. No appreciable fracture. Plantar and Achilles calcaneal spurs. No foreign body is observed. IMPRESSION: Plantar and Achilles calcaneal spurs. Otherwise, no significant  abnormalities are observed. Electronically Signed   By: Gaylyn RongWalter  Liebkemann M.D.   On: 04/01/2019 14:41    Procedures Procedures (including critical care time)  Medications Ordered in UC Medications - No data to display  Initial Impression / Assessment and Plan / UC Course  I have reviewed the triage vital signs and the nursing notes.  Pertinent labs & imaging results that were available during my care of the patient were reviewed by me and considered in my medical  decision making (see chart for details).     X-rays negative, likely sprains.  Will provide crutches given pain in bilateral ankles/feet for comfort.  Ace wrap was provided.  Tylenol and ibuprofen, ice and elevation.  Discussed strict return precautions. Patient verbalized understanding and is agreeable with plan.  Final Clinical Impressions(s) / UC Diagnoses   Final diagnoses:  Sprain of right ankle, unspecified ligament, initial encounter  Foot sprain, left, initial encounter     Discharge Instructions     Use anti-inflammatories for pain/swelling. You may take up to 800 mg Ibuprofen every 8 hours with food. You may supplement Ibuprofen with Tylenol [phone] mg every 8 hours.  Ice and elevate Weight bear as tolerated Follow up if not resolving or worsening    ED Prescriptions    Medication Sig Dispense Auth. Provider   ibuprofen (ADVIL) 800 MG tablet Take 1 tablet (800 mg total) by mouth 3 (three) times daily. 21 tablet , [place] C, PA-C     PDMP not reviewed this encounter.   Janith Lima, [place] 04/01/19 1503

## 2019-04-01 NOTE — ED Triage Notes (Addendum)
Pt reports stumbling up stairs when she fell, injuring bilat feet.  Denies any head injury.  Also c/o some tenderness to right ankle; states felt a "pop" in right ankle.  CMS intact BLE.  Has been applying ice.  Slight swelling to right lateral foot and left dorsal foot.

## 2019-05-02 ENCOUNTER — Emergency Department (HOSPITAL_COMMUNITY)
Admission: EM | Admit: 2019-05-02 | Discharge: 2019-05-02 | Disposition: A | Discharge status: Home / Self Care | Payer: BC Managed Care – PPO | Attending: Emergency Medicine | Admitting: Emergency Medicine

## 2019-05-02 ENCOUNTER — Encounter (HOSPITAL_COMMUNITY): Payer: Self-pay | Admitting: Emergency Medicine

## 2019-05-02 ENCOUNTER — Emergency Department (HOSPITAL_COMMUNITY): Payer: BC Managed Care – PPO

## 2019-05-02 ENCOUNTER — Other Ambulatory Visit: Payer: Self-pay

## 2019-05-02 DIAGNOSIS — F17210 Nicotine dependence, cigarettes, uncomplicated: Secondary | ICD-10-CM | POA: Insufficient documentation

## 2019-05-02 DIAGNOSIS — G510 Bell's palsy: Secondary | ICD-10-CM | POA: Diagnosis not present

## 2019-05-02 DIAGNOSIS — Z79899 Other long term (current) drug therapy: Secondary | ICD-10-CM | POA: Insufficient documentation

## 2019-05-02 DIAGNOSIS — R29810 Facial weakness: Secondary | ICD-10-CM | POA: Diagnosis present

## 2019-05-02 LAB — I-STAT CHEM 8, ED
BUN: 11 mg/dL (ref 6–20)
Calcium, Ion: 1.22 mmol/L (ref 1.15–1.40)
Chloride: 105 mmol/L (ref 98–111)
Creatinine, Ser: 0.7 mg/dL (ref 0.44–1.00)
Glucose, Bld: 98 mg/dL (ref 70–99)
HCT: 34 % — ABNORMAL LOW (ref 36.0–46.0)
Hemoglobin: 11.6 g/dL — ABNORMAL LOW (ref 12.0–15.0)
Potassium: 4 mmol/L (ref 3.5–5.1)
Sodium: 141 mmol/L (ref 135–145)
TCO2: 26 mmol/L (ref 22–32)

## 2019-05-02 LAB — I-STAT BETA HCG BLOOD, ED (MC, WL, AP ONLY): I-stat hCG, quantitative: <5 m[IU]/mL (ref <5)

## 2019-05-02 MED ORDER — SODIUM CHLORIDE (PF) 0.9 % IJ SOLN
INTRAMUSCULAR | Status: AC
  Filled 2019-05-02: qty 50

## 2019-05-02 MED ORDER — PREDNISONE 20 MG PO TABS
60.0000 mg | ORAL_TABLET | Freq: Once | ORAL | Status: AC
  Administered 2019-05-02: 60 mg via ORAL
  Filled 2019-05-02: qty 3

## 2019-05-02 MED ORDER — PREDNISONE 20 MG PO TABS: ORAL_TABLET | ORAL | 0 refills | Status: DC

## 2019-05-02 MED ORDER — VALACYCLOVIR HCL 1 G PO TABS: 1000.0000 mg | ORAL_TABLET | Freq: Three times a day (TID) | ORAL | 0 refills | Status: DC

## 2019-05-02 MED ORDER — ARTIFICIAL TEARS OPHTHALMIC OINT: TOPICAL_OINTMENT | Freq: Every day | OPHTHALMIC | 0 refills | Status: DC

## 2019-05-02 MED ORDER — VALACYCLOVIR HCL 500 MG PO TABS
1000.0000 mg | ORAL_TABLET | Freq: Once | ORAL | Status: AC
  Administered 2019-05-02: 1000 mg via ORAL
  Filled 2019-05-02: qty 2

## 2019-05-02 MED ORDER — IOHEXOL 350 MG/ML SOLN
100.0000 mL | Freq: Once | INTRAVENOUS | Status: AC | PRN
  Administered 2019-05-02: 100 mL via INTRAVENOUS

## 2019-05-02 NOTE — ED Notes (Signed)
Light green and lavender sent to lab in save tubes.

## 2019-05-02 NOTE — ED Provider Notes (Signed)
[facility]-EMERGENCY DEPT Provider Note   CSN: [id number] Arrival date & time: 05/02/19  1313     History Chief Complaint  Patient presents with   Facial Droop   Neck Pain    Michele Walker is a 38 y.o. female.  HPI  38 year old female presents with concern for Bell's palsy and posterior neck/head pain.  States she noticed a headache/neck pain last night.  It is one specific spot at the junction of her head and neck on the right side.  Seems to go down to her trapezius.  Better today than it was yesterday.  However at around 11 AM she all of a sudden developed recurrent Bell's palsy with right-sided facial droop, right-sided tongue numbness and inability to fully close her eye.  No significant headache besides the localized pain on the back of her neck.  No weakness or numbness in her arms or legs. Felt like there was a knot in the back of her head when palpating last night.  Past Medical History:  Diagnosis Date   Hepatitis C    Hepatitis C     There are no problems to display for this patient.   Past Surgical History:  Procedure Laterality Date   CESAREAN SECTION     CHOLECYSTECTOMY     TUBAL LIGATION       OB History   No obstetric history on file.     Family History  Problem Relation Age of Onset   Healthy Mother    Healthy Father     Social History   Tobacco Use   Smoking status: Current Every Day Smoker    Packs/day: 0.75    Types: Cigarettes   Smokeless tobacco: Never Used  Substance Use Topics   Alcohol use: No   Drug use: Yes    Types: Marijuana    Home Medications Prior to Admission medications   Medication Sig Start Date End Date Taking? Authorizing Provider  artificial tears (LACRILUBE) OINT ophthalmic ointment Place into both eyes at bedtime. 05/02/19   Pricilla Loveless, MD  cephALEXin (KEFLEX) 500 MG capsule Take 1 capsule (500 mg total) by mouth 4 (four) times daily. 06/16/15   Geoffery Lyons, MD    HYDROcodone-acetaminophen (NORCO/VICODIN) 5-325 MG per tablet Take 1-2 tablets every 6 hours as needed for severe pain 11/21/14   Renne Crigler, PA-C  ibuprofen (ADVIL) 800 MG tablet Take 1 tablet (800 mg total) by mouth 3 (three) times daily. 04/01/19   Wieters, Hallie C, PA-C  predniSONE (DELTASONE) 20 MG tablet 3 tabs po daily x 2 days, then 2 tabs x 3 days, then 1.5 tabs x 3 days, then 1 tab x 3 days, then 0.5 tabs x 3 days 05/03/19   Pricilla Loveless, MD  valACYclovir (VALTREX) 1000 MG tablet Take 1 tablet (1,000 mg total) by mouth 3 (three) times daily. 05/02/19   Pricilla Loveless, MD  ferrous sulfate 325 (65 FE) MG tablet Take 325 mg by mouth daily with breakfast.  04/02/14  [provider]  metoCLOPramide (REGLAN) 10 MG tablet Take 1 tablet (10 mg total) by mouth every 6 (six) hours. 08/25/13 04/02/14  Phillis Haggis, MD    Allergies    Morphine and related and Penicillins  Review of Systems   Review of Systems  Constitutional: Negative for fever.  Musculoskeletal: Positive for neck pain. Negative for neck stiffness.  Neurological: Positive for numbness. Negative for speech difficulty and headaches.       Right sided facial droop  All other systems reviewed and are negative.   Physical Exam Updated Vital Signs BP (!) 126/97    Pulse 97    Temp 98.3 F (36.8 C) (Oral)    Resp 19    LMP 05/01/2019    SpO2 100%   Physical Exam Vitals and nursing note reviewed.  Constitutional:      Appearance: She is well-developed.  HENT:     Head: Normocephalic and atraumatic.     Right Ear: External ear normal.     Left Ear: External ear normal.     Nose: Nose normal.  Eyes:     General:        Right eye: No discharge.        Left eye: No discharge.     Extraocular Movements: Extraocular movements intact.     Pupils: Pupils are equal, round, and reactive to light.  Neck:   Cardiovascular:     Rate and Rhythm: Normal rate and regular rhythm.     Heart sounds: Normal heart  sounds.  Pulmonary:     Effort: Pulmonary effort is normal.     Breath sounds: Normal breath sounds.  Abdominal:     Palpations: Abdomen is soft.     Tenderness: There is no abdominal tenderness.  Musculoskeletal:     Cervical back: Normal range of motion and neck supple. No rigidity or tenderness.  Skin:    General: Skin is warm and dry.  Neurological:     Mental Status: She is alert.     Comments: Right sided bell's palsy. Forehead is not spared. Subjective decreased sensation to right face. Eyelid is weak on right. 5/5 strength in all 4 extremities. Grossly normal sensation. Normal finger to nose.   Psychiatric:        Mood and Affect: Mood is not anxious.     ED Results / Procedures / Treatments   Labs (all labs ordered are listed, but only abnormal results are displayed) Labs Reviewed  I-STAT CHEM 8, ED - Abnormal; Notable for the following components:      Result Value   Hemoglobin 11.6 (*)    HCT 34.0 (*)    All other components within normal limits  I-STAT BETA HCG BLOOD, ED ([facility], [facility], [facility] ONLY)    EKG None  Radiology CT Angio Head W or Wo Contrast  Result Date: 05/02/2019 CLINICAL DATA:  Right posterior neck pain, right tongue numbness EXAM: CT ANGIOGRAPHY HEAD AND NECK TECHNIQUE: Multidetector CT imaging of the head and neck was performed using the standard protocol during bolus administration of intravenous contrast. Multiplanar CT image reconstructions and MIPs were obtained to evaluate the vascular anatomy. Carotid stenosis measurements (when applicable) are obtained utilizing NASCET criteria, using the distal internal carotid diameter as the denominator. CONTRAST:  100mL OMNIPAQUE IOHEXOL 350 MG/ML SOLN COMPARISON:  None. FINDINGS: CT HEAD FINDINGS Brain: There is no acute intracranial hemorrhage, mass effect, or edema. Normal gray-white differentiation is preserved. Ventricles and sulci are normal in size and configuration. There is no extra-axial fluid collection.  Vascular: No hyperdense vessel. Skull: Unremarkable. Sinuses: Opacification of the right frontal sinus and partial opacification of the anterior right ethmoid sinuses. Small retention cyst of the right maxillary sinus. Orbits: Unremarkable. Review of the MIP images confirms the above findings CTA NECK FINDINGS Aortic arch: Great vessel origins are patent. Right carotid system: Common, internal, and external carotid arteries are patent. There is no hemodynamically significant stenosis or evidence of dissection. Left carotid system: Common, internal,  and external carotid arteries are patent. There is no hemodynamically significant stenosis or evidence of dissection. Vertebral arteries: Patent. Right vertebral artery is slightly dominant. No stenosis or evidence of dissection. Skeleton: Unremarkable. Other neck: No neck mass or adenopathy Upper chest: No apical lung mass. Review of the MIP images confirms the above findings CTA HEAD FINDINGS Anterior circulation: Intracranial internal carotid arteries are patent. Anterior and middle cerebral arteries are patent. Posterior circulation: Intracranial vertebral arteries, basilar artery, and posterior cerebral arteries are patent. A right posterior communicating artery is present. Venous sinuses: As permitted by contrast timing, patent. Review of the MIP images confirms the above findings IMPRESSION: Patent anterior and posterior circulations. No significant stenosis or evidence of dissection. No acute intracranial abnormality. Electronically Signed   By: Guadlupe Spanish M.D.   On: 05/02/2019 15:29   CT Angio Neck W and/or Wo Contrast  Result Date: 05/02/2019 CLINICAL DATA:  Right posterior neck pain, right tongue numbness EXAM: CT ANGIOGRAPHY HEAD AND NECK TECHNIQUE: Multidetector CT imaging of the head and neck was performed using the standard protocol during bolus administration of intravenous contrast. Multiplanar CT image reconstructions and MIPs were obtained to  evaluate the vascular anatomy. Carotid stenosis measurements (when applicable) are obtained utilizing NASCET criteria, using the distal internal carotid diameter as the denominator. CONTRAST:  OMNIPAQUE IOHEXOL 350 MG/ML SOLN COMPARISON:  None. FINDINGS: CT HEAD FINDINGS Brain: There is no acute intracranial hemorrhage, mass effect, or edema. Normal gray-white differentiation is preserved. Ventricles and sulci are normal in size and configuration. There is no extra-axial fluid collection. Vascular: No hyperdense vessel. Skull: Unremarkable. Sinuses: Opacification of the right frontal sinus and partial opacification of the anterior right ethmoid sinuses. Small retention cyst of the right maxillary sinus. Orbits: Unremarkable. Review of the MIP images confirms the above findings CTA NECK FINDINGS Aortic arch: Great vessel origins are patent. Right carotid system: Common, internal, and external carotid arteries are patent. There is no hemodynamically significant stenosis or evidence of dissection. Left carotid system: Common, internal, and external carotid arteries are patent. There is no hemodynamically significant stenosis or evidence of dissection. Vertebral arteries: Patent. Right vertebral artery is slightly dominant. No stenosis or evidence of dissection. Skeleton: Unremarkable. Other neck: No neck mass or adenopathy Upper chest: No apical lung mass. Review of the MIP images confirms the above findings CTA HEAD FINDINGS Anterior circulation: Intracranial internal carotid arteries are patent. Anterior and middle cerebral arteries are patent. Posterior circulation: Intracranial vertebral arteries, basilar artery, and posterior cerebral arteries are patent. A right posterior communicating artery is present. Venous sinuses: As permitted by contrast timing, patent. Review of the MIP images confirms the above findings IMPRESSION: Patent anterior and posterior circulations. No significant stenosis or evidence of  dissection. No acute intracranial abnormality. Electronically Signed   By: Guadlupe Spanish M.D.   On: 05/02/2019 15:29    Procedures Procedures (including critical care time)  Medications Ordered in ED Medications  sodium chloride (PF) 0.9 % injection (0 mLs  Hold 05/02/19 1449)  predniSONE (DELTASONE) tablet 60 mg (60 mg Oral Given 05/02/19 1401)  valACYclovir (VALTREX) tablet 1,000 mg (1,000 mg Oral Given 05/02/19 1401)  iohexol (OMNIPAQUE) 350 MG/ML injection 100 mL (100 mLs Intravenous Contrast Given 05/02/19 1453)    ED Course  I have reviewed the triage vital signs and the nursing notes.  Pertinent labs & imaging results that were available during my care of the patient were reviewed by me and considered in my medical decision making (see  chart for details).    MDM Rules/Calculators/A&P                      Presentation is consistent with Bell's palsy.  The neck injury in a not sure if it is truly connected.  CT angiography was obtained given location but this would not really make sense for her to have a vertebral artery dissection causing the Bell's.  It is very localized.  My suspicion of subarachnoid hemorrhage is pretty low.  Given this is negative, I think she can be treated for Bell's palsy and follow-up with a PCP Final Clinical Impression(s) / ED Diagnoses Final diagnoses:  Bell's palsy    Rx / DC Orders ED Discharge Orders         Ordered    predniSONE (DELTASONE) 20 MG tablet     05/02/19 1534    valACYclovir (VALTREX) 1000 MG tablet  3 times daily     05/02/19 1534    artificial tears (LACRILUBE) OINT ophthalmic ointment  Daily at bedtime     05/02/19 1534           Sherwood Gambler, MD 05/02/19 1535

## 2019-05-02 NOTE — ED Notes (Signed)
Patient transported to CT 

## 2019-05-02 NOTE — ED Triage Notes (Signed)
Pt c/o neck pains that started last night and today woke up with numb tongue. Around 11am today right mouth started drooping and states her right eye having hard time blinking.
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
## 2020-10-07 IMAGING — CT CT ANGIO HEAD
2 of 13 series · 7 of 33 positions shown · IV contrast (omnipaque)
Comparison: None.

CLINICAL DATA: Right posterior neck pain, right tongue numbness

EXAM:
CT ANGIOGRAPHY HEAD AND NECK
TECHNIQUE: Multidetector CT imaging of the head and neck was performed using
the standard protocol during bolus administration of intravenous
contrast. Multiplanar CT image reconstructions and MIPs were
obtained to evaluate the vascular anatomy. Carotid stenosis
measurements (when applicable) are obtained utilizing NASCET
criteria, using the distal internal carotid diameter as the
denominator.
CONTRAST:  100mL OMNIPAQUE IOHEXOL 350 MG/ML SOLN

[Series 9: cta head neck · axial · 0.45mm/px · z∈[-224,-108]mm · 2 of 174 slices shown]
[im 58/174  soft-tissue]
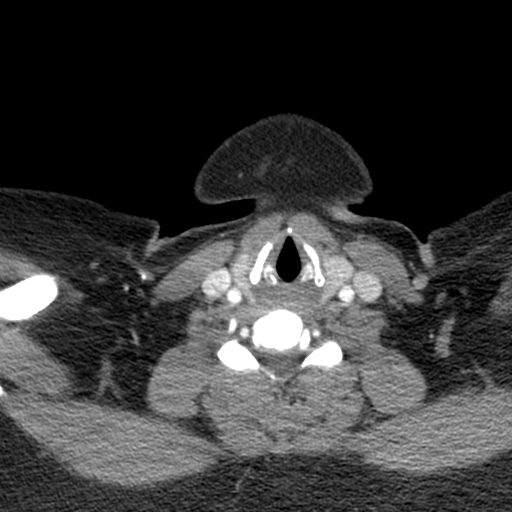
[im 116/174  soft-tissue]
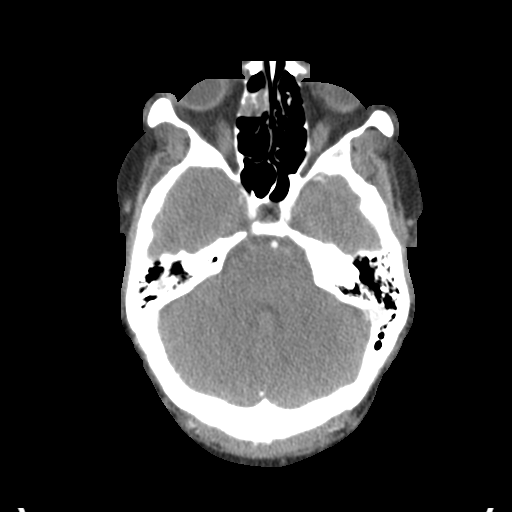

[Series 11: ax thin · axial · 0.39mm/px · z∈[-280,-50]mm · 5 of 347 slices shown]
[im 58/347  soft-tissue]
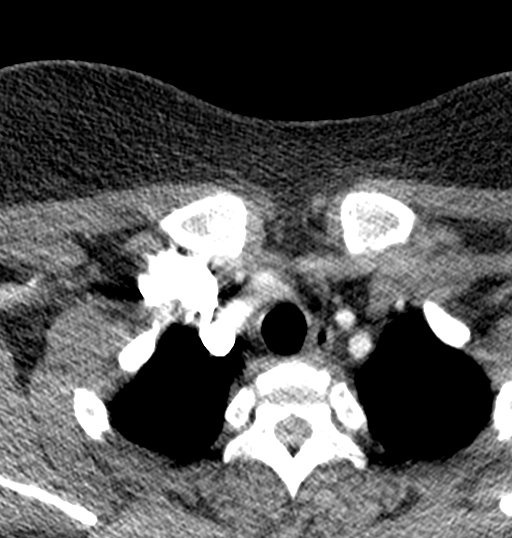
[im 116/347  bone]
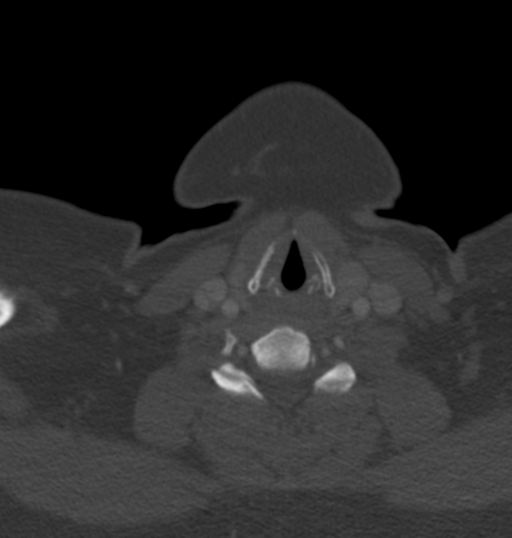
[im 174/347  soft-tissue]
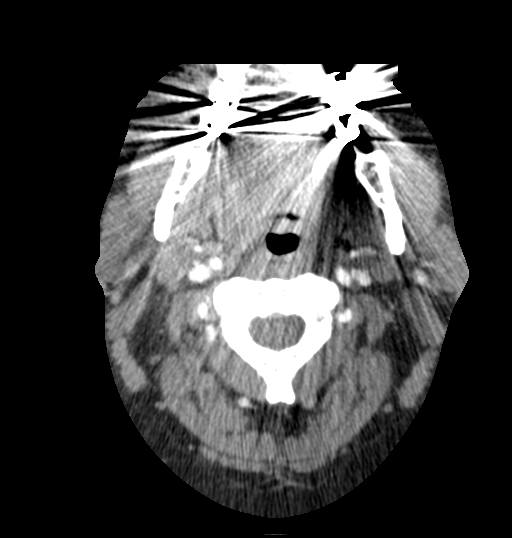
[im 231/347  bone]
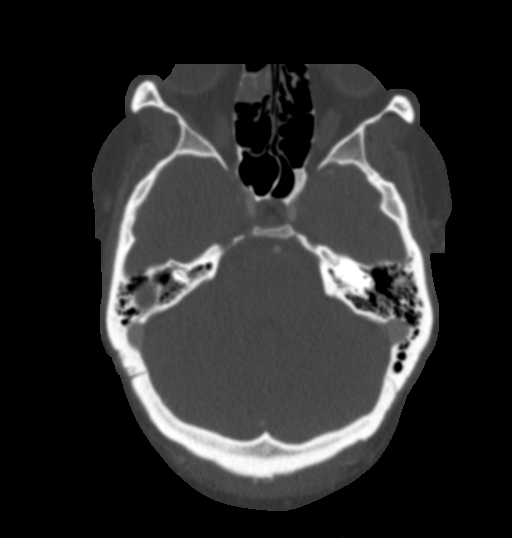
[im 289/347  soft-tissue]
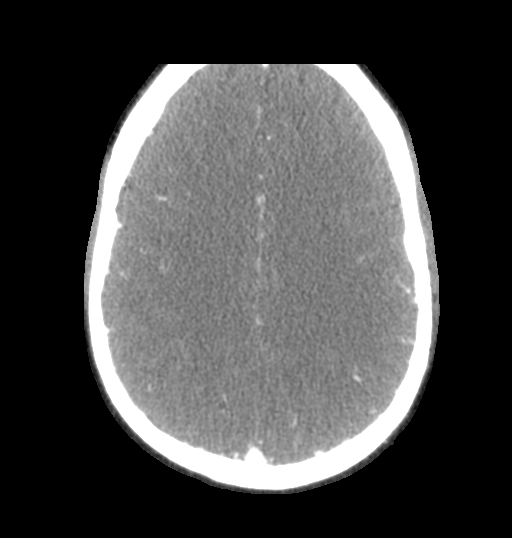

[7 of 33 positions shown; findings below may reference images not displayed]

FINDINGS: CT HEAD FINDINGS

Brain: There is no acute intracranial hemorrhage, mass effect, or
edema. Normal gray-white differentiation is preserved. Ventricles
and sulci are normal in size and configuration. There is no
extra-axial fluid collection.

Vascular: No hyperdense vessel.

Skull: Unremarkable.

Sinuses: Opacification of the right frontal sinus and partial
opacification of the anterior right ethmoid sinuses. Small retention
cyst of the right maxillary sinus.

Orbits: Unremarkable.

Review of the MIP images confirms the above findings

CTA NECK FINDINGS

Aortic arch: Great vessel origins are patent.

Right carotid system: Common, internal, and external carotid
arteries are patent. There is no hemodynamically significant
stenosis or evidence of dissection.

Left carotid system: Common, internal, and external carotid arteries
are patent. There is no hemodynamically significant stenosis or
evidence of dissection.

Vertebral arteries: Patent. Right vertebral artery is slightly
dominant. No stenosis or evidence of dissection.

Skeleton: Unremarkable.

Other neck: No neck mass or adenopathy

Upper chest: No apical lung mass.

Review of the MIP images confirms the above findings

CTA HEAD FINDINGS

Anterior circulation: Intracranial internal carotid arteries are
patent. Anterior and middle cerebral arteries are patent.

Posterior circulation: Intracranial vertebral arteries, basilar
artery, and posterior cerebral arteries are patent. A right
posterior communicating artery is present.

Venous sinuses: As permitted by contrast timing, patent.

Review of the MIP images confirms the above findings
IMPRESSION: Patent anterior and posterior circulations. No significant stenosis
or evidence of dissection.

No acute intracranial abnormality.

## 2021-02-05 ENCOUNTER — Other Ambulatory Visit: Payer: Self-pay

## 2022-04-29 ENCOUNTER — Other Ambulatory Visit: Payer: Self-pay | Admitting: Radiology

## 2022-04-29 DIAGNOSIS — N6452 Nipple discharge: Secondary | ICD-10-CM

## 2022-08-17 ENCOUNTER — Emergency Department (HOSPITAL_BASED_OUTPATIENT_CLINIC_OR_DEPARTMENT_OTHER)
Admission: EM | Admit: 2022-08-17 | Discharge: 2022-08-17 | Disposition: A | Discharge status: Home / Self Care | Payer: BC Managed Care – PPO | Source: Home / Self Care | Attending: Emergency Medicine | Admitting: Emergency Medicine

## 2022-08-17 ENCOUNTER — Emergency Department (HOSPITAL_BASED_OUTPATIENT_CLINIC_OR_DEPARTMENT_OTHER): Payer: BC Managed Care – PPO

## 2022-08-17 ENCOUNTER — Encounter (HOSPITAL_BASED_OUTPATIENT_CLINIC_OR_DEPARTMENT_OTHER): Payer: Self-pay | Admitting: Emergency Medicine

## 2022-08-17 ENCOUNTER — Other Ambulatory Visit: Payer: Self-pay

## 2022-08-17 DIAGNOSIS — R7989 Other specified abnormal findings of blood chemistry: Secondary | ICD-10-CM | POA: Insufficient documentation

## 2022-08-17 DIAGNOSIS — R1032 Left lower quadrant pain: Secondary | ICD-10-CM | POA: Insufficient documentation

## 2022-08-17 DIAGNOSIS — R1033 Periumbilical pain: Secondary | ICD-10-CM | POA: Insufficient documentation

## 2022-08-17 DIAGNOSIS — R1013 Epigastric pain: Secondary | ICD-10-CM | POA: Insufficient documentation

## 2022-08-17 DIAGNOSIS — R109 Unspecified abdominal pain: Secondary | ICD-10-CM

## 2022-08-17 DIAGNOSIS — K8500 Idiopathic acute pancreatitis without necrosis or infection: Secondary | ICD-10-CM | POA: Diagnosis not present

## 2022-08-17 LAB — HCG, SERUM, QUALITATIVE: Preg, Serum: NEGATIVE

## 2022-08-17 LAB — CBC
HCT: 34.8 % — ABNORMAL LOW (ref 36.0–46.0)
Hemoglobin: 10.5 g/dL — ABNORMAL LOW (ref 12.0–15.0)
MCH: 21 pg — ABNORMAL LOW (ref 26.0–34.0)
MCHC: 30.2 g/dL (ref 30.0–36.0)
MCV: 69.7 fL — ABNORMAL LOW (ref 80.0–100.0)
Platelets: 356 10*3/uL (ref 150–400)
RBC: 4.99 MIL/uL (ref 3.87–5.11)
RDW: 17.5 % — ABNORMAL HIGH (ref 11.5–15.5)
WBC: 12.7 10*3/uL — ABNORMAL HIGH (ref 4.0–10.5)
nRBC: 0 % (ref 0.0–0.2)

## 2022-08-17 LAB — LIPASE, BLOOD: Lipase: 236 U/L — ABNORMAL HIGH (ref 11–51)

## 2022-08-17 LAB — COMPREHENSIVE METABOLIC PANEL
ALT: 11 U/L (ref 0–44)
AST: 13 U/L — ABNORMAL LOW (ref 15–41)
Albumin: 4 g/dL (ref 3.5–5.0)
Alkaline Phosphatase: 69 U/L (ref 38–126)
Anion gap: 9 (ref 5–15)
BUN: 10 mg/dL (ref 6–20)
CO2: 26 mmol/L (ref 22–32)
Calcium: 9.5 mg/dL (ref 8.9–10.3)
Chloride: 105 mmol/L (ref 98–111)
Creatinine, Ser: 0.78 mg/dL (ref 0.44–1.00)
GFR, Estimated: >60 mL/min (ref >60)
Glucose, Bld: 88 mg/dL (ref 70–99)
Potassium: 3.6 mmol/L (ref 3.5–5.1)
Sodium: 140 mmol/L (ref 135–145)
Total Bilirubin: 0.4 mg/dL (ref 0.3–1.2)
Total Protein: 7.5 g/dL (ref 6.5–8.1)

## 2022-08-17 MED ORDER — ONDANSETRON HCL 4 MG PO TABS: 4.0000 mg | ORAL_TABLET | Freq: Three times a day (TID) | ORAL | 0 refills | Status: DC | PRN

## 2022-08-17 MED ORDER — SODIUM CHLORIDE 0.9 % IV BOLUS
500.0000 mL | Freq: Once | INTRAVENOUS | Status: AC
  Administered 2022-08-17: 500 mL via INTRAVENOUS

## 2022-08-17 MED ORDER — IOHEXOL 300 MG/ML  SOLN
100.0000 mL | Freq: Once | INTRAMUSCULAR | Status: AC | PRN
  Administered 2022-08-17: 100 mL via INTRAVENOUS

## 2022-08-17 MED ORDER — HYDROCODONE-ACETAMINOPHEN 5-325 MG PO TABS: 1.0000 | ORAL_TABLET | Freq: Four times a day (QID) | ORAL | 0 refills | Status: DC | PRN

## 2022-08-17 MED ORDER — ONDANSETRON HCL 4 MG/2ML IJ SOLN
4.0000 mg | Freq: Once | INTRAMUSCULAR | Status: AC
  Administered 2022-08-17: 4 mg via INTRAVENOUS
  Filled 2022-08-17: qty 2

## 2022-08-17 MED ORDER — HYDROMORPHONE HCL 1 MG/ML IJ SOLN
1.0000 mg | Freq: Once | INTRAMUSCULAR | Status: AC
  Administered 2022-08-17: 1 mg via INTRAVENOUS
  Filled 2022-08-17: qty 1

## 2022-08-17 NOTE — ED Provider Notes (Signed)
[facility] Provider Note   CSN: [id number] Arrival date & time: 08/17/22  1414     History  Chief Complaint  Patient presents with   Abdominal Pain    Michele Walker is a 42 y.o. female.  HPI   42 year old female presents emergency department with left-sided abdominal pain.  Patient states that this started 2 days ago.  Initially was more epigastric and left-sided and now is radiating down to her left lower quadrant.  She endorses small amounts of diarrhea but is mainly complaining of nausea, vomiting.  Patient has had a cholecystectomy, 2 C-sections and tubal ligation.  Denies any fever but endorses chills.  Denies any lower pelvic pain, vaginal bleeding, vaginal discharge, dysuria.  Home Medications Prior to Admission medications   Medication Sig Start Date End Date Taking? Authorizing Provider  artificial tears (LACRILUBE) OINT ophthalmic ointment Place into both eyes at bedtime. 05/02/19   Sherwood Gambler, MD  cephALEXin (KEFLEX) 500 MG capsule Take 1 capsule (500 mg total) by mouth 4 (four) times daily. 06/16/15   Veryl Speak, MD  HYDROcodone-acetaminophen (NORCO/VICODIN) 5-325 MG per tablet Take 1-2 tablets every 6 hours as needed for severe pain 11/21/14   Carlisle Cater, PA-C  ibuprofen (ADVIL) 800 MG tablet Take 1 tablet (800 mg total) by mouth 3 (three) times daily. 04/01/19   Wieters, Hallie C, PA-C  predniSONE (DELTASONE) 20 MG tablet 3 tabs po daily x 2 days, then 2 tabs x 3 days, then 1.5 tabs x 3 days, then 1 tab x 3 days, then 0.5 tabs x 3 days 05/03/19   Sherwood Gambler, MD  valACYclovir (VALTREX) 1000 MG tablet Take 1 tablet (1,000 mg total) by mouth 3 (three) times daily. 05/02/19   Sherwood Gambler, MD  ferrous sulfate 325 (65 FE) MG tablet Take 325 mg by mouth daily with breakfast.  04/02/14  [provider]  metoCLOPramide (REGLAN) 10 MG tablet Take 1 tablet (10 mg total) by mouth every 6 (six) hours. 08/25/13  04/02/14  Pixie Casino, MD      Allergies    Morphine and related and Penicillins    Review of Systems   Review of Systems  Constitutional:  Positive for appetite change and fatigue. Negative for fever.  Respiratory:  Negative for shortness of breath.   Cardiovascular:  Negative for chest pain.  Gastrointestinal:  Positive for abdominal pain, diarrhea, nausea and vomiting.  Genitourinary:  Negative for dysuria.  Skin:  Negative for rash.  Neurological:  Negative for headaches.    Physical Exam Updated Vital Signs BP (!) 150/105   Pulse 82   Temp 98.2 F (36.8 C) (Oral)   Resp 17   SpO2 99%  Physical Exam Vitals and nursing note reviewed.  Constitutional:      Appearance: Normal appearance.  HENT:     Head: Normocephalic.     Mouth/Throat:     Mouth: Mucous membranes are moist.  Cardiovascular:     Rate and Rhythm: Normal rate.  Pulmonary:     Effort: Pulmonary effort is normal. No respiratory distress.  Abdominal:     General: Bowel sounds are normal. There is no distension.     Palpations: Abdomen is soft.     Tenderness: There is abdominal tenderness in the epigastric area, periumbilical area and left lower quadrant. There is guarding. There is no rebound.  Skin:    General: Skin is warm.  Neurological:     Mental Status: She is alert  and oriented to person, place, and time. Mental status is at baseline.  Psychiatric:        Mood and Affect: Mood normal.     ED Results / Procedures / Treatments   Labs (all labs ordered are listed, but only abnormal results are displayed) Labs Reviewed  LIPASE, BLOOD - Abnormal; Notable for the following components:      Result Value   Lipase 236 (*)    All other components within normal limits  COMPREHENSIVE METABOLIC PANEL - Abnormal; Notable for the following components:   AST 13 (*)    All other components within normal limits  CBC - Abnormal; Notable for the following components:   WBC 12.7 (*)    Hemoglobin 10.5  (*)    HCT 34.8 (*)    MCV 69.7 (*)    MCH 21.0 (*)    RDW 17.5 (*)    All other components within normal limits  HCG, SERUM, QUALITATIVE  URINALYSIS, ROUTINE W REFLEX MICROSCOPIC  PREGNANCY, URINE    EKG EKG Interpretation  Date/Time:  Tuesday August 17 2022 15:54:19 EDT Ventricular Rate:  85 PR Interval:  153 QRS Duration: 95 QT Interval:  400 QTC Calculation: 476 R Axis:   68 Text Interpretation: Sinus rhythm Low voltage, precordial leads Abnormal R-wave progression, early transition Confirmed by Lavenia Atlas [phone]) on 08/17/2022 3:55:13 PM  Radiology No results found.  Procedures Procedures    Medications Ordered in ED Medications  ondansetron (ZOFRAN) injection 4 mg (4 mg Intravenous Given 08/17/22 1557)  HYDROmorphone (DILAUDID) injection 1 mg (1 mg Intravenous Given 08/17/22 1557)  sodium chloride 0.9 % bolus 500 mL (500 mLs Intravenous New Bag/Given 08/17/22 1559)    ED Course/ Medical Decision Making/ A&P                             Medical Decision Making Amount and/or Complexity of Data Reviewed Labs: ordered. Radiology: ordered.  Risk Prescription drug management.   42 year old female presents emergency department with left-sided abdominal pain.  Vitals are stable on arrival.  She has reproducible epigastric/periumbilical and left-sided abdominal pain to palpation.  No guarding, no distention.  Blood work is reassuring besides a lipase that is slightly elevated in the 200s.  CT of the abdomen pelvis identifies no acute finding/complication.  After IV fluids, pain medicine and nausea medicine patient feels significantly improved.  She is able to tolerate p.o.  Plan for outpatient symptomatic treatment of mild pancreatitis with strict return to ED precautions.  Do not feel patient warrants emergent admission.  Patient at this time appears safe and stable for discharge and close outpatient follow up. Discharge plan and strict return to ED precautions  discussed, patient verbalizes understanding and agreement.        Final Clinical Impression(s) / ED Diagnoses Final diagnoses:  None    Rx / DC Orders ED Discharge Orders     None         Lorelle Gibbs, DO 08/17/22 1824

## 2022-08-17 NOTE — Discharge Instructions (Addendum)
You have been seen and discharged from the emergency department.  Your workup was normal besides an elevated pancreatic enzyme.  CT of the abdomen pelvis did not and did not find any acute finding/complication.  I believe you are suffering from mild pancreatitis.  You were given fluids, pain medicine and nausea medicine here in the department.  Treatment for this is clear liquid diet for the next 48 hours in addition to pain medicine/nausea medicine.  Following that you may advance to a bland diet including toast, brown rice, bananas.  Advance diet as tolerated.  In regards to the pain medicine, do not mix this medication with alcohol or other sedating medications. Do not drive or do heavy physical activity until you know how this medication affects you.  It may cause drowsiness.  Follow-up with your primary provider for further evaluation and further care. Take home medications as prescribed. If you have any worsening symptoms, high fevers, severely worsening abdominal pain, inability to tolerate medications or further concerns for your health please return to an emergency department for further evaluation.

## 2022-08-17 NOTE — ED Triage Notes (Signed)
Arrives POV to ED. Tearful. Ambulatory to triage.  Left abd pain into back and both legs. Started Sunday. Worsening last night- difficulty sleeping. Tenderness to LUQ. Gallbladder previously removed. Afebrile. +N/ +V/+D.

## 2022-08-19 ENCOUNTER — Encounter (HOSPITAL_BASED_OUTPATIENT_CLINIC_OR_DEPARTMENT_OTHER): Payer: Self-pay

## 2022-08-19 ENCOUNTER — Inpatient Hospital Stay (HOSPITAL_BASED_OUTPATIENT_CLINIC_OR_DEPARTMENT_OTHER)
Admission: EM | Admit: 2022-08-19 | Discharge: 2022-08-24 | DRG: 440 | Disposition: A | Discharge status: Home / Self Care | Payer: BC Managed Care – PPO | Attending: Internal Medicine | Admitting: Internal Medicine

## 2022-08-19 ENCOUNTER — Other Ambulatory Visit: Payer: Self-pay

## 2022-08-19 DIAGNOSIS — Z79899 Other long term (current) drug therapy: Secondary | ICD-10-CM

## 2022-08-19 DIAGNOSIS — K5900 Constipation, unspecified: Secondary | ICD-10-CM | POA: Diagnosis present

## 2022-08-19 DIAGNOSIS — Z9049 Acquired absence of other specified parts of digestive tract: Secondary | ICD-10-CM

## 2022-08-19 DIAGNOSIS — R112 Nausea with vomiting, unspecified: Secondary | ICD-10-CM

## 2022-08-19 DIAGNOSIS — F17210 Nicotine dependence, cigarettes, uncomplicated: Secondary | ICD-10-CM | POA: Diagnosis present

## 2022-08-19 DIAGNOSIS — D509 Iron deficiency anemia, unspecified: Secondary | ICD-10-CM | POA: Diagnosis present

## 2022-08-19 DIAGNOSIS — K8500 Idiopathic acute pancreatitis without necrosis or infection: Principal | ICD-10-CM | POA: Diagnosis present

## 2022-08-19 DIAGNOSIS — K859 Acute pancreatitis without necrosis or infection, unspecified: Secondary | ICD-10-CM | POA: Diagnosis present

## 2022-08-19 DIAGNOSIS — Z6839 Body mass index (BMI) 39.0-39.9, adult: Secondary | ICD-10-CM

## 2022-08-19 DIAGNOSIS — E876 Hypokalemia: Secondary | ICD-10-CM | POA: Diagnosis present

## 2022-08-19 MED ORDER — ONDANSETRON HCL 4 MG/2ML IJ SOLN
4.0000 mg | Freq: Once | INTRAMUSCULAR | Status: AC
  Administered 2022-08-19: 4 mg via INTRAVENOUS
  Filled 2022-08-19: qty 2

## 2022-08-19 MED ORDER — LACTATED RINGERS IV BOLUS
1000.0000 mL | Freq: Once | INTRAVENOUS | Status: AC
  Administered 2022-08-19: 1000 mL via INTRAVENOUS

## 2022-08-19 MED ORDER — HYDROMORPHONE HCL 1 MG/ML IJ SOLN
1.0000 mg | Freq: Once | INTRAMUSCULAR | Status: AC
  Administered 2022-08-19: 1 mg via INTRAVENOUS
  Filled 2022-08-19: qty 1

## 2022-08-19 MED ORDER — KETOROLAC TROMETHAMINE 15 MG/ML IJ SOLN
15.0000 mg | Freq: Once | INTRAMUSCULAR | Status: AC
  Administered 2022-08-19: 15 mg via INTRAVENOUS
  Filled 2022-08-19: qty 1

## 2022-08-19 NOTE — ED Triage Notes (Signed)
Patient here POV from Home.  Endorses N/V/D and left Lower ABD/Flank pain for 5 Days ago. Seen for Same 2-3 Days ago. Seen by GI today and instructed to go to the [facility] to a different one and then here today.   NAD Noted during triage. A&Ox4. GCS 15. Ambulatory.

## 2022-08-19 NOTE — ED Notes (Signed)
Report given to the Floor RN. 

## 2022-08-19 NOTE — ED Provider Notes (Signed)
[facility] Provider Note   CSN: [id number] Arrival date & time: 08/19/22  1745     History  Chief Complaint  Patient presents with   Emesis    Michele Walker is a 42 y.o. female.   Emesis    This is a 42 year old female with medical history of 2 ligation, cholecystectomy, prior C-section, hep C in remission presenting to the emergency department due to epigastric pain.  Patient was seen in the ED 2 days ago and diagnosed with pancreatitis, she has been unable to eat or drink since then.  She has been doing a clear liquid diet but is nauseated and vomiting.  The pain is uncontrollable.  Seen at [facility] regional/gastroenterology office prior to this visit, lab work was done earlier.  She does endorse some marijuana use, no history of cannabis hyperemesis.  She denies any recent change in medication, no alcohol use.  Home Medications Prior to Admission medications   Medication Sig Start Date End Date Taking? Authorizing Provider  artificial tears (LACRILUBE) OINT ophthalmic ointment Place into both eyes at bedtime. 05/02/19   Sherwood Gambler, MD  cephALEXin (KEFLEX) 500 MG capsule Take 1 capsule (500 mg total) by mouth 4 (four) times daily. 06/16/15   Veryl Speak, MD  HYDROcodone-acetaminophen (NORCO) 5-325 MG tablet Take 1 tablet by mouth every 6 (six) hours as needed for moderate pain. 08/17/22   Horton, Alvin Critchley, DO  ibuprofen (ADVIL) 800 MG tablet Take 1 tablet (800 mg total) by mouth 3 (three) times daily. 04/01/19   Wieters, Hallie C, PA-C  ondansetron (ZOFRAN) 4 MG tablet Take 1 tablet (4 mg total) by mouth every 8 (eight) hours as needed for nausea or vomiting. 08/17/22   Horton, Drue Dun M, DO  predniSONE (DELTASONE) 20 MG tablet 3 tabs po daily x 2 days, then 2 tabs x 3 days, then 1.5 tabs x 3 days, then 1 tab x 3 days, then 0.5 tabs x 3 days 05/03/19   Sherwood Gambler, MD  valACYclovir (VALTREX) 1000 MG tablet Take 1 tablet (1,000 mg  total) by mouth 3 (three) times daily. 05/02/19   Sherwood Gambler, MD  ferrous sulfate 325 (65 FE) MG tablet Take 325 mg by mouth daily with breakfast.  04/02/14  [provider]  metoCLOPramide (REGLAN) 10 MG tablet Take 1 tablet (10 mg total) by mouth every 6 (six) hours. 08/25/13 04/02/14  Pixie Casino, MD      Allergies    Morphine and related and Penicillins    Review of Systems   Review of Systems  Gastrointestinal:  Positive for vomiting.    Physical Exam Updated Vital Signs BP (!) 146/99 (BP Location: Right Arm)   Pulse 88   Temp 98 F (36.7 C) (Oral)   Resp 18   Ht 5\' 7"  (1.702 m)   Wt 113.4 kg   SpO2 100%   BMI 39.16 kg/m  Physical Exam Vitals and nursing note reviewed. Exam conducted with a chaperone present.  Constitutional:      Appearance: Normal appearance.  HENT:     Head: Normocephalic and atraumatic.  Eyes:     General: No scleral icterus.       Right eye: No discharge.        Left eye: No discharge.     Extraocular Movements: Extraocular movements intact.     Pupils: Pupils are equal, round, and reactive to light.  Cardiovascular:     Rate and Rhythm: Normal rate  and regular rhythm.     Pulses: Normal pulses.     Heart sounds: Normal heart sounds.     No friction rub. No gallop.  Pulmonary:     Effort: Pulmonary effort is normal. No respiratory distress.     Breath sounds: Normal breath sounds.  Abdominal:     General: Abdomen is flat. Bowel sounds are normal. There is no distension.     Palpations: Abdomen is soft.     Tenderness: There is abdominal tenderness.     Comments: Epigastric tenderness  Skin:    General: Skin is warm and dry.     Coloration: Skin is not jaundiced.  Neurological:     Mental Status: She is alert. Mental status is at baseline.     Coordination: Coordination normal.     ED Results / Procedures / Treatments   Labs (all labs ordered are listed, but only abnormal results are displayed) Labs Reviewed - No  data to display  EKG None  Radiology No results found.  Procedures Procedures    Medications Ordered in ED Medications  lactated ringers bolus 1,000 mL (has no administration in time range)  ondansetron (ZOFRAN) injection 4 mg (has no administration in time range)  HYDROmorphone (DILAUDID) injection 1 mg (has no administration in time range)    ED Course/ Medical Decision Making/ A&P                             Medical Decision Making Risk Prescription drug management. Decision regarding hospitalization.   Patient presents to the emergency department due to continued nausea, vomiting and epigastric pain in the setting of recently diagnosed pancreatitis.  Differential also includes dehydration, AKI, electrolyte derangement.  Lab workup was completed at 12:34 PM prior to arrival.  No white count, patient was anemic with a hemoglobin of 10.5.  Lipase 97, decreased from 2 days ago and it was 236.  CMP is without gross electrolyte intermittent, AKI or transaminates.  CT abdomen pelvis with contrast from 2 days ago was unremarkable.  Patient was also not pregnant 2 days ago when serum quant was obtained.  None  Patient need admission for intractable nausea and vomiting and pain in the setting of acute pancreatitis.  There is no obvious inciting agent for the pancreatitis, this could be an element of cannabis hyperemesis given daily marijuana use this would not account for the elevated lipase.  I will consult hospitalist for admission.        Final Clinical Impression(s) / ED Diagnoses Final diagnoses:  Idiopathic acute pancreatitis, unspecified complication status  Intractable nausea and vomiting    Rx / DC Orders ED Discharge Orders     None         Theron Arista, Cordelia Poche 08/19/22 1837    Linwood Dibbles, MD 08/20/22 1539

## 2022-08-19 NOTE — ED Notes (Signed)
Report given to the next RN... 

## 2022-08-19 NOTE — ED Notes (Signed)
Yameka at [facility] will send transport for Bed Ready at [place] Room#[id number].-ABB(NS)

## 2022-08-20 DIAGNOSIS — Z79899 Other long term (current) drug therapy: Secondary | ICD-10-CM | POA: Diagnosis not present

## 2022-08-20 DIAGNOSIS — K859 Acute pancreatitis without necrosis or infection, unspecified: Secondary | ICD-10-CM

## 2022-08-20 DIAGNOSIS — K8500 Idiopathic acute pancreatitis without necrosis or infection: Secondary | ICD-10-CM | POA: Diagnosis present

## 2022-08-20 DIAGNOSIS — D509 Iron deficiency anemia, unspecified: Secondary | ICD-10-CM | POA: Diagnosis present

## 2022-08-20 DIAGNOSIS — E876 Hypokalemia: Secondary | ICD-10-CM | POA: Diagnosis present

## 2022-08-20 DIAGNOSIS — F17210 Nicotine dependence, cigarettes, uncomplicated: Secondary | ICD-10-CM | POA: Diagnosis present

## 2022-08-20 DIAGNOSIS — K5900 Constipation, unspecified: Secondary | ICD-10-CM | POA: Diagnosis present

## 2022-08-20 DIAGNOSIS — Z6839 Body mass index (BMI) 39.0-39.9, adult: Secondary | ICD-10-CM | POA: Diagnosis not present

## 2022-08-20 DIAGNOSIS — Z9049 Acquired absence of other specified parts of digestive tract: Secondary | ICD-10-CM | POA: Diagnosis not present

## 2022-08-20 LAB — COMPREHENSIVE METABOLIC PANEL
ALT: 113 U/L — ABNORMAL HIGH (ref 0–44)
AST: 179 U/L — ABNORMAL HIGH (ref 15–41)
Albumin: 3.7 g/dL (ref 3.5–5.0)
Alkaline Phosphatase: 82 U/L (ref 38–126)
Anion gap: 6 (ref 5–15)
BUN: 10 mg/dL (ref 6–20)
CO2: 28 mmol/L (ref 22–32)
Calcium: 9 mg/dL (ref 8.9–10.3)
Chloride: 105 mmol/L (ref 98–111)
Creatinine, Ser: 0.69 mg/dL (ref 0.44–1.00)
GFR, Estimated: >60 mL/min (ref >60)
Glucose, Bld: 97 mg/dL (ref 70–99)
Potassium: 3.8 mmol/L (ref 3.5–5.1)
Sodium: 139 mmol/L (ref 135–145)
Total Bilirubin: 1.6 mg/dL — ABNORMAL HIGH (ref 0.3–1.2)
Total Protein: 6.9 g/dL (ref 6.5–8.1)

## 2022-08-20 LAB — CBC
HCT: 33.5 % — ABNORMAL LOW (ref 36.0–46.0)
Hemoglobin: 9.7 g/dL — ABNORMAL LOW (ref 12.0–15.0)
MCH: 20.7 pg — ABNORMAL LOW (ref 26.0–34.0)
MCHC: 29 g/dL — ABNORMAL LOW (ref 30.0–36.0)
MCV: 71.6 fL — ABNORMAL LOW (ref 80.0–100.0)
Platelets: 282 10*3/uL (ref 150–400)
RBC: 4.68 MIL/uL (ref 3.87–5.11)
RDW: 17.2 % — ABNORMAL HIGH (ref 11.5–15.5)
WBC: 7.4 10*3/uL (ref 4.0–10.5)
nRBC: 0 % (ref 0.0–0.2)

## 2022-08-20 LAB — LIPASE, BLOOD: Lipase: 48 U/L (ref 11–51)

## 2022-08-20 LAB — TRIGLYCERIDES: Triglycerides: 120 mg/dL (ref <150)

## 2022-08-20 LAB — MAGNESIUM: Magnesium: 2.1 mg/dL (ref 1.7–2.4)

## 2022-08-20 LAB — PHOSPHORUS: Phosphorus: 4.4 mg/dL (ref 2.5–4.6)

## 2022-08-20 MED ORDER — OXYCODONE HCL 5 MG PO TABS
5.0000 mg | ORAL_TABLET | Freq: Four times a day (QID) | ORAL | Status: DC | PRN
  Administered 2022-08-20: 5 mg via ORAL
  Filled 2022-08-20 (×2): qty 1

## 2022-08-20 MED ORDER — HYDROMORPHONE HCL 1 MG/ML IJ SOLN
1.0000 mg | Freq: Once | INTRAMUSCULAR | Status: AC
  Administered 2022-08-20: 1 mg via INTRAVENOUS
  Filled 2022-08-20: qty 1

## 2022-08-20 MED ORDER — LACTATED RINGERS IV SOLN: INTRAVENOUS | Status: DC

## 2022-08-20 MED ORDER — ENOXAPARIN SODIUM 40 MG/0.4ML IJ SOSY
40.0000 mg | PREFILLED_SYRINGE | INTRAMUSCULAR | Status: DC
  Administered 2022-08-20 – 2022-08-24 (×5): 40 mg via SUBCUTANEOUS
  Filled 2022-08-20 (×5): qty 0.4

## 2022-08-20 MED ORDER — ONDANSETRON HCL 4 MG/2ML IJ SOLN
4.0000 mg | Freq: Four times a day (QID) | INTRAMUSCULAR | Status: DC | PRN
  Administered 2022-08-21 – 2022-08-22 (×3): 4 mg via INTRAVENOUS
  Filled 2022-08-20 (×4): qty 2

## 2022-08-20 MED ORDER — ONDANSETRON HCL 4 MG/2ML IJ SOLN
4.0000 mg | Freq: Once | INTRAMUSCULAR | Status: AC
  Administered 2022-08-20: 4 mg via INTRAVENOUS
  Filled 2022-08-20: qty 2

## 2022-08-20 MED ORDER — HYDROMORPHONE HCL 1 MG/ML IJ SOLN
0.5000 mg | INTRAMUSCULAR | Status: DC | PRN
  Administered 2022-08-20 – 2022-08-21 (×4): 0.5 mg via INTRAVENOUS
  Filled 2022-08-20 (×4): qty 0.5

## 2022-08-20 MED ORDER — HYDROMORPHONE HCL 1 MG/ML IJ SOLN
0.5000 mg | INTRAMUSCULAR | Status: DC | PRN
  Administered 2022-08-20: 0.5 mg via INTRAVENOUS
  Filled 2022-08-20: qty 0.5

## 2022-08-20 MED ORDER — ACETAMINOPHEN 325 MG PO TABS
650.0000 mg | ORAL_TABLET | Freq: Four times a day (QID) | ORAL | Status: DC | PRN
  Administered 2022-08-20: 650 mg via ORAL
  Filled 2022-08-20 (×2): qty 2

## 2022-08-20 MED ORDER — PROCHLORPERAZINE EDISYLATE 10 MG/2ML IJ SOLN
5.0000 mg | Freq: Four times a day (QID) | INTRAMUSCULAR | Status: DC | PRN
  Administered 2022-08-21 – 2022-08-22 (×2): 5 mg via INTRAVENOUS
  Filled 2022-08-20 (×2): qty 2

## 2022-08-20 MED ORDER — NICOTINE 14 MG/24HR TD PT24
14.0000 mg | MEDICATED_PATCH | Freq: Every day | TRANSDERMAL | Status: DC
  Administered 2022-08-20 – 2022-08-24 (×5): 14 mg via TRANSDERMAL
  Filled 2022-08-20 (×5): qty 1

## 2022-08-20 MED ORDER — PROCHLORPERAZINE EDISYLATE 10 MG/2ML IJ SOLN
5.0000 mg | Freq: Four times a day (QID) | INTRAMUSCULAR | Status: DC | PRN
  Administered 2022-08-20: 5 mg via INTRAVENOUS
  Filled 2022-08-20: qty 2

## 2022-08-20 NOTE — H&P (Addendum)
History and Physical  Michele AlbeeHolly Tippetts [id number] DOB: 09/28/1980 DOA: 08/19/2022  Referring physician: [facility].  PCP: Alm Bustardorn, Henry H, MD  Outpatient Specialists: GI Patient coming from: Home  Chief Complaint: Epigastric pain  HPI: Michele Walker is a 42 y.o. female with medical history significant for severe morbid obesity, hepatitis C in remission, prior C-section, cholecystectomy, who [facility] due to epigastric pain of 2 days duration progressively worsening.  Radiating to the back.  The patient was seen in the ED 2 days ago and diagnosed with acute pancreatitis.  She has been unable to eat or drink since then.  Has been doing a clear liquid diet.  Has not been able to tolerate due to nausea and vomiting.  Prior to this presentation, [facility].  Lab work was done.  No use of alcohol.  Smokes 1 pack/day.  In the ED, workup revealed acute pancreatitis.  [facility], hospitalist service, was called to admit.  The patient was [facility].  ED Course: 8.3.  BP 130/92, pulse 67, respiratory 18, saturation 99% on room air.  Lab studies remarkable for WBC 12.7 hemoglobin 10.5, MCV 69.7 on 08/17/2022.  Lipase 236 on 08/17/2022  Review of Systems: Review of systems as noted in the HPI. All other systems reviewed and are negative.   Past Medical History:  Diagnosis Date   Hepatitis C    Hepatitis C    Past Surgical History:  Procedure Laterality Date   CESAREAN SECTION     CHOLECYSTECTOMY     TUBAL LIGATION      Social History:  reports that she has been smoking cigarettes. She has been smoking an average of .5 packs per day. She has never used smokeless tobacco. She reports current drug use. Drug: Marijuana. She reports that she does not drink alcohol.   Allergies  Allergen Reactions   Morphine And Related     Upper  stomach pain   Penicillins     rash    Family History  Problem Relation Age of Onset   Healthy Mother    Healthy Father       Prior to Admission medications   Medication Sig Start Date End Date Taking? Authorizing Provider  artificial tears (LACRILUBE) OINT ophthalmic ointment Place into both eyes at bedtime. 05/02/19   Pricilla LovelessGoldston, Scott, MD  cephALEXin (KEFLEX) 500 MG capsule Take 1 capsule (500 mg total) by mouth 4 (four) times daily. 06/16/15   Geoffery Lyonselo, Douglas, MD  HYDROcodone-acetaminophen (NORCO) 5-325 MG tablet Take 1 tablet by mouth every 6 (six) hours as needed for moderate pain. 08/17/22   Horton, Clabe SealKristie M, DO  ibuprofen (ADVIL) 800 MG tablet Take 1 tablet (800 mg total) by mouth 3 (three) times daily. 04/01/19   Wieters, Hallie C, PA-C  ondansetron (ZOFRAN) 4 MG tablet Take 1 tablet (4 mg total) by mouth every 8 (eight) hours as needed for nausea or vomiting. 08/17/22   Horton, Danford BadKristie M, DO  predniSONE (DELTASONE) 20 MG tablet 3 tabs po daily x 2 days, then 2 tabs x 3 days, then 1.5 tabs x 3 days, then 1 tab x 3 days, then 0.5 tabs x 3 days 05/03/19   Pricilla LovelessGoldston, Scott, MD  valACYclovir (VALTREX) 1000 MG tablet Take 1 tablet (1,000 mg total) by mouth 3 (three) times daily. 05/02/19   Pricilla LovelessGoldston, Scott, MD  ferrous sulfate  325 (65 FE) MG tablet Take 325 mg by mouth daily with breakfast.  04/02/14  [provider]  metoCLOPramide (REGLAN) 10 MG tablet Take 1 tablet (10 mg total) by mouth every 6 (six) hours. 08/25/13 04/02/14  Phillis Haggis, MD    Physical Exam: BP (!) 130/92 (BP Location: Left Arm)   Pulse (!) 57   Temp 98.3 F (36.8 C) (Oral)   Resp 18   Ht 5\' 7"  (1.702 m)   Wt 113.4 kg   SpO2 99%   BMI 39.16 kg/m   General: 42 y.o. year-old female well developed well nourished in no acute distress.  Alert and oriented x3. Cardiovascular: Regular rate and rhythm with no rubs or gallops.  No thyromegaly or JVD noted.  No lower extremity edema. 2/4 pulses in all 4  extremities. Respiratory: Clear to auscultation with no wheezes or rales. Good inspiratory effort. Abdomen: Soft tender with palpation at epigastric region.  Nondistended with normal bowel sounds x4 quadrants. Muskuloskeletal: No cyanosis, clubbing or edema noted bilaterally Neuro: CN II-XII intact, strength, sensation, reflexes Skin: No ulcerative lesions noted or rashes Psychiatry: Judgement and insight appear normal. Mood is appropriate for condition and setting          Labs on Admission:  Basic Metabolic Panel: Recent Labs  Lab 08/17/22 1517  NA 140  K 3.6  CL 105  CO2 26  GLUCOSE 88  BUN 10  CREATININE 0.78  CALCIUM 9.5   Liver Function Tests: Recent Labs  Lab 08/17/22 1517  AST 13*  ALT 11  ALKPHOS 69  BILITOT 0.4  PROT 7.5  ALBUMIN 4.0   Recent Labs  Lab 08/17/22 1517  LIPASE 236*   No results for input(s): "AMMONIA" in the last 168 hours. CBC: Recent Labs  Lab 08/17/22 1517  WBC 12.7*  HGB 10.5*  HCT 34.8*  MCV 69.7*  PLT 356   Cardiac Enzymes: No results for input(s): "CKTOTAL", "CKMB", "CKMBINDEX", "TROPONINI" in the last 168 hours.  BNP (last 3 results) No results for input(s): "BNP" in the last 8760 hours.  ProBNP (last 3 results) No results for input(s): "PROBNP" in the last 8760 hours.  CBG: No results for input(s): "GLUCAP" in the last 168 hours.  Radiological Exams on Admission: No results found.  EKG: I independently viewed the EKG done and my findings are as followed: Sinus rhythm rate of 94.  Nonspecific ST-T changes.  QTc 517.  Assessment/Plan Present on Admission:  Acute pancreatitis  Principal Problem:   Acute pancreatitis  Acute pancreatitis Presented with epigastric pain radiating to the back. Lipase level was 236 on 08/17/22. Denies use of alcohol Current tobacco user, tobacco cessation counseling done at bedside IV analgesics as needed IV antiemetics as needed LR 75 cc/h x 2 days Start clear liquid diet when  ready Repeat CMP and lipase level. Postcholecystectomy, no common bile duct dilatation, seen on CT scan done on 08/17/2022. Check triglyceride level.  Chronic microcytic anemia Hemoglobin 10.5 with MCV of 69 Monitor H&H  Obesity BMI 39 Recommend weight loss outpatient with regular physical activity and healthy dieting.  Tobacco use disorder Smokes 1 pack/day Tobacco cessation counseling at bedside. Patient request nicotine patch   DVT prophylaxis: Subcu Lovenox daily  Code Status: Full code  Family Communication: None at bedside.  Disposition Plan: Admitted to MedSurg unit  Consults called: None.  Admission status: Inpatient status.   Status is: Inpatient The patient requires at least 2 midnights for further evaluation and treatment of  present condition.   Darlin Droparole N Apollonia Amini MD [facility] Pager [phone]  If 7PM-7AM, please contact night-coverage [email or web address] Password [email or web address]  08/20/2022, 2:15 AM

## 2022-08-20 NOTE — Progress Notes (Signed)
Patient admitted early this morning for acute pancreatitis and has been started on IV fluids and analgesics.  Patient seen and examined at bedside and plan of care discussed with her and husband at bedside.  I have reviewed patient's medical records including this morning's H&P, current vitals and medications myself.  Triglycerides 120.  Continue IV fluids.  Repeat a.m. labs including LFTs.  Continue n.p.o. for now and start clear liquid tonight.

## 2022-08-20 NOTE — ED Notes (Signed)
Telephone report given to [facility] for transport.  

## 2022-08-20 NOTE — Progress Notes (Signed)
Paging service returned call. They stated that they will notify the on call doctor of pt's arrival to unit.

## 2022-08-20 NOTE — Progress Notes (Signed)
Paged [facility] hospitalist pager in regards to this new admit arriving at [phone] .  Awaiting return call.

## 2022-08-20 NOTE — Progress Notes (Signed)
Pt arrived to unit via [facility]. She is warm, dry, no visible distress.

## 2022-08-21 DIAGNOSIS — K859 Acute pancreatitis without necrosis or infection, unspecified: Secondary | ICD-10-CM | POA: Diagnosis not present

## 2022-08-21 LAB — COMPREHENSIVE METABOLIC PANEL
ALT: 119 U/L — ABNORMAL HIGH (ref 0–44)
AST: 121 U/L — ABNORMAL HIGH (ref 15–41)
Albumin: 3.5 g/dL (ref 3.5–5.0)
Alkaline Phosphatase: 83 U/L (ref 38–126)
Anion gap: 6 (ref 5–15)
BUN: 7 mg/dL (ref 6–20)
CO2: 26 mmol/L (ref 22–32)
Calcium: 8.5 mg/dL — ABNORMAL LOW (ref 8.9–10.3)
Chloride: 105 mmol/L (ref 98–111)
Creatinine, Ser: 0.66 mg/dL (ref 0.44–1.00)
GFR, Estimated: >60 mL/min (ref >60)
Glucose, Bld: 97 mg/dL (ref 70–99)
Potassium: 3.3 mmol/L — ABNORMAL LOW (ref 3.5–5.1)
Sodium: 137 mmol/L (ref 135–145)
Total Bilirubin: 0.9 mg/dL (ref 0.3–1.2)
Total Protein: 6.5 g/dL (ref 6.5–8.1)

## 2022-08-21 LAB — MAGNESIUM: Magnesium: 1.9 mg/dL (ref 1.7–2.4)

## 2022-08-21 LAB — HIV ANTIBODY (ROUTINE TESTING W REFLEX): HIV Screen 4th Generation wRfx: NONREACTIVE

## 2022-08-21 MED ORDER — HYDROMORPHONE HCL 1 MG/ML IJ SOLN
0.5000 mg | INTRAMUSCULAR | Status: DC | PRN
  Administered 2022-08-21 – 2022-08-22 (×4): 1 mg via INTRAVENOUS
  Filled 2022-08-21 (×4): qty 1

## 2022-08-21 MED ORDER — POTASSIUM CHLORIDE CRYS ER 20 MEQ PO TBCR
40.0000 meq | EXTENDED_RELEASE_TABLET | Freq: Once | ORAL | Status: AC
  Administered 2022-08-21: 40 meq via ORAL
  Filled 2022-08-21: qty 2

## 2022-08-21 MED ORDER — OXYCODONE HCL 5 MG PO TABS
5.0000 mg | ORAL_TABLET | ORAL | Status: DC | PRN
  Administered 2022-08-21: 5 mg via ORAL
  Administered 2022-08-21 – 2022-08-24 (×10): 10 mg via ORAL
  Filled 2022-08-21 (×8): qty 2
  Filled 2022-08-21: qty 1
  Filled 2022-08-21 (×2): qty 2

## 2022-08-21 MED ORDER — LACTATED RINGERS IV SOLN: INTRAVENOUS | Status: DC

## 2022-08-21 MED ORDER — METHOCARBAMOL 500 MG PO TABS
500.0000 mg | ORAL_TABLET | Freq: Four times a day (QID) | ORAL | Status: DC | PRN
  Administered 2022-08-21 – 2022-08-23 (×7): 500 mg via ORAL
  Filled 2022-08-21 (×7): qty 1

## 2022-08-21 NOTE — Progress Notes (Signed)
PROGRESS NOTE    Michele Walker  ZOX:[id number] DOB: 06/10/1980 DOA: 08/19/2022 PCP: Alm Bustardorn, Henry H, MD   Brief Narrative:  42 year old female with history of hepatitis C in remission, cholecystectomy presented with worsening epigastric pain radiating to the back.  On presentation, workup revealed acute pancreatitis.  She was started on IV fluids and analgesics.  Assessment & Plan:   Acute pancreatitis Elevated LFTs -Presented with abdominal pain and was found to have lipase of 236.  Subsequently, lipase has already improved to 48. -Currently on IV fluids and analgesics.  Currently on clear liquid diet.  Advance to full liquid diet today if tolerated. -No CBD dilatation seen on CT scan done on 08/17/2022 -Triglycerides 123  -LFTs are still elevated but mild.  Repeat a.m. labs  Hypokalemia -Replace.  Repeat a.m. labs  Leukocytosis -Resolved  Chronic microcytic anemia -Hemoglobin stable.  Monitor intermittently  Obesity -Outpatient follow-up  Ongoing tobacco use -Counseling provided by admitting hospitalist.  Continue nicotine patch   DVT prophylaxis: Subcutaneous Lovenox Code Status: Full Family Communication: Husband at bedside Disposition Plan: Status is: Inpatient Remains inpatient appropriate because: Of severity of illness    Consultants: None  Procedures: None  Antimicrobials: None   Subjective: Patient seen and examined at bedside.  Still complains of intermittent abdominal pain with back pain but improving.  Tolerating clear liquid diet.  No fever or worsening shortness of breath reported  Objective: Vitals:   08/20/22 1748 08/20/22 1848 08/20/22 2105 08/21/22 0604  BP: (!) 142/110 (!) 157/87 139/86 123/89  Pulse: 65 (!) 59 65 (!) 59  Resp: 16  18 16   Temp: 98.6 F (37 C)  99 F (37.2 C) 98 F (36.7 C)  TempSrc: Oral  Oral Oral  SpO2: 98% 100% 100% 100%  Weight:      Height:        Intake/Output Summary (Last 24 hours) at 08/21/2022 0759 Last data  filed at 08/21/2022 [id number] Gross per 24 hour  Intake 240 ml  Output 2450 ml  Net -2210 ml   Filed Weights   08/19/22 1752  Weight: 113.4 kg    Examination:  General exam: Appears calm and comfortable.  On room air. Respiratory system: Bilateral decreased breath sounds at bases, no wheezing Cardiovascular system: S1 & S2 heard, mild intermittent bradycardia present Gastrointestinal system: Abdomen is obese, slightly distended, soft and tender in the right upper quadrant. Normal bowel sounds heard. Extremities: No cyanosis, clubbing, edema  Central nervous system: Alert and oriented. No focal neurological deficits. Moving extremities Skin: No rashes, lesions or ulcers Psychiatry: Judgement and insight appear normal. Mood & affect appropriate.     Data Reviewed: I have personally reviewed following labs and imaging studies  CBC: Recent Labs  Lab 08/17/22 1517 08/20/22 0417  WBC 12.7* 7.4  HGB 10.5* 9.7*  HCT 34.8* 33.5*  MCV 69.7* 71.6*  PLT 356 282   Basic Metabolic Panel: Recent Labs  Lab 08/17/22 1517 08/20/22 0417 08/21/22 0532  NA 140 139 137  K 3.6 3.8 3.3*  CL 105 105 105  CO2 26 28 26   GLUCOSE 88 97 97  BUN 10 10 7   CREATININE 0.78 0.69 0.66  CALCIUM 9.5 9.0 8.5*  MG  --  2.1 1.9  PHOS  --  4.4  --    GFR: Estimated Creatinine Clearance: 120.2 mL/min (by C-G formula based on SCr of 0.66 mg/dL). Liver Function Tests: Recent Labs  Lab 08/17/22 1517 08/20/22 0417 08/21/22 0532  AST 13* 179* 121*  ALT 11 113* 119*  ALKPHOS 69 82 83  BILITOT 0.4 1.6* 0.9  PROT 7.5 6.9 6.5  ALBUMIN 4.0 3.7 3.5   Recent Labs  Lab 08/17/22 1517 08/20/22 0417  LIPASE 236* 48   No results for input(s): "AMMONIA" in the last 168 hours. Coagulation Profile: No results for input(s): "INR", "PROTIME" in the last 168 hours. Cardiac Enzymes: No results for input(s): "CKTOTAL", "CKMB", "CKMBINDEX", "TROPONINI" in the last 168 hours. BNP (last 3 results) No results for  input(s): "PROBNP" in the last 8760 hours. HbA1C: No results for input(s): "HGBA1C" in the last 72 hours. CBG: No results for input(s): "GLUCAP" in the last 168 hours. Lipid Profile: Recent Labs    08/20/22 0417  TRIG 120   Thyroid Function Tests: No results for input(s): "TSH", "T4TOTAL", "FREET4", "T3FREE", "THYROIDAB" in the last 72 hours. Anemia Panel: No results for input(s): "VITAMINB12", "FOLATE", "FERRITIN", "TIBC", "IRON", "RETICCTPCT" in the last 72 hours. Sepsis Labs: No results for input(s): "PROCALCITON", "LATICACIDVEN" in the last 168 hours.  No results found for this or any previous visit (from the past 240 hour(s)).       Radiology Studies: No results found.      Scheduled Meds:  enoxaparin (LOVENOX) injection  40 mg Subcutaneous Q24H   nicotine  14 mg Transdermal Daily   Continuous Infusions:  lactated ringers 125 mL/hr at 08/21/22 0356          Glade Lloyd, MD [facility] 08/21/2022, 7:59 AM

## 2022-08-22 DIAGNOSIS — K859 Acute pancreatitis without necrosis or infection, unspecified: Secondary | ICD-10-CM | POA: Diagnosis not present

## 2022-08-22 LAB — CBC WITH DIFFERENTIAL/PLATELET
Abs Immature Granulocytes: 0.03 10*3/uL (ref 0.00–0.07)
Basophils Absolute: 0.1 10*3/uL (ref 0.0–0.1)
Basophils Relative: 1 %
Eosinophils Absolute: 0.2 10*3/uL (ref 0.0–0.5)
Eosinophils Relative: 3 %
HCT: 32 % — ABNORMAL LOW (ref 36.0–46.0)
Hemoglobin: 9.3 g/dL — ABNORMAL LOW (ref 12.0–15.0)
Immature Granulocytes: 1 %
Lymphocytes Relative: 33 %
Lymphs Abs: 2.2 10*3/uL (ref 0.7–4.0)
MCH: 20.8 pg — ABNORMAL LOW (ref 26.0–34.0)
MCHC: 29.1 g/dL — ABNORMAL LOW (ref 30.0–36.0)
MCV: 71.6 fL — ABNORMAL LOW (ref 80.0–100.0)
Monocytes Absolute: 0.5 10*3/uL (ref 0.1–1.0)
Monocytes Relative: 8 %
Neutro Abs: 3.6 10*3/uL (ref 1.7–7.7)
Neutrophils Relative %: 54 %
Platelets: 281 10*3/uL (ref 150–400)
RBC: 4.47 MIL/uL (ref 3.87–5.11)
RDW: 17 % — ABNORMAL HIGH (ref 11.5–15.5)
WBC: 6.6 10*3/uL (ref 4.0–10.5)
nRBC: 0 % (ref 0.0–0.2)

## 2022-08-22 LAB — COMPREHENSIVE METABOLIC PANEL
ALT: 120 U/L — ABNORMAL HIGH (ref 0–44)
AST: 94 U/L — ABNORMAL HIGH (ref 15–41)
Albumin: 3.6 g/dL (ref 3.5–5.0)
Alkaline Phosphatase: 91 U/L (ref 38–126)
Anion gap: 4 — ABNORMAL LOW (ref 5–15)
BUN: <5 mg/dL — ABNORMAL LOW (ref 6–20)
CO2: 27 mmol/L (ref 22–32)
Calcium: 8.7 mg/dL — ABNORMAL LOW (ref 8.9–10.3)
Chloride: 105 mmol/L (ref 98–111)
Creatinine, Ser: 0.7 mg/dL (ref 0.44–1.00)
GFR, Estimated: >60 mL/min (ref >60)
Glucose, Bld: 97 mg/dL (ref 70–99)
Potassium: 4 mmol/L (ref 3.5–5.1)
Sodium: 136 mmol/L (ref 135–145)
Total Bilirubin: 0.8 mg/dL (ref 0.3–1.2)
Total Protein: 6.7 g/dL (ref 6.5–8.1)

## 2022-08-22 LAB — HEPATITIS PANEL, ACUTE
HCV Ab: REACTIVE — AB
Hep A IgM: NONREACTIVE
Hep B C IgM: NONREACTIVE
Hepatitis B Surface Ag: NONREACTIVE

## 2022-08-22 LAB — MAGNESIUM: Magnesium: 1.9 mg/dL (ref 1.7–2.4)

## 2022-08-22 MED ORDER — PANTOPRAZOLE SODIUM 40 MG IV SOLR
40.0000 mg | Freq: Two times a day (BID) | INTRAVENOUS | Status: DC
  Administered 2022-08-22 – 2022-08-24 (×5): 40 mg via INTRAVENOUS
  Filled 2022-08-22 (×5): qty 10

## 2022-08-22 MED ORDER — PROCHLORPERAZINE EDISYLATE 10 MG/2ML IJ SOLN
10.0000 mg | Freq: Four times a day (QID) | INTRAMUSCULAR | Status: DC | PRN
  Administered 2022-08-23: 10 mg via INTRAVENOUS

## 2022-08-22 MED ORDER — ONDANSETRON HCL 4 MG/2ML IJ SOLN
4.0000 mg | Freq: Four times a day (QID) | INTRAMUSCULAR | Status: DC
  Administered 2022-08-22 – 2022-08-23 (×3): 4 mg via INTRAVENOUS
  Filled 2022-08-22 (×3): qty 2

## 2022-08-22 MED ORDER — ALUM & MAG HYDROXIDE-SIMETH 200-200-20 MG/5ML PO SUSP: 30.0000 mL | ORAL | Status: DC | PRN

## 2022-08-22 NOTE — Progress Notes (Signed)
PROGRESS NOTE    Michele Walker  JTT:[id number] DOB: 1980/08/02 DOA: 08/19/2022 PCP: Alm Bustard, MD   Brief Narrative:  42 year old female with history of hepatitis C in remission, cholecystectomy presented with worsening epigastric pain radiating to the back.  On presentation, workup revealed acute pancreatitis.  She was started on IV fluids and analgesics.  Assessment & Plan:   Acute pancreatitis Elevated LFTs -Presented with abdominal pain and was found to have lipase of 236.  Subsequently, lipase has already improved to 48. -Currently on IV fluids and analgesics.  Currently on full liquid diet.  Advance to soft  diet today if tolerated. -No CBD dilatation seen on CT scan done on 08/17/2022 -Triglycerides 123  -LFTs are still elevated but mild.  Repeat a.m. labs.  Follow hepatitis panel.  Hypokalemia -Improved  Leukocytosis -Resolved  Chronic microcytic anemia -Hemoglobin stable.  Monitor intermittently  Obesity -Outpatient follow-up  Ongoing tobacco use -Counseling provided by admitting hospitalist.  Continue nicotine patch   DVT prophylaxis: Subcutaneous Lovenox Code Status: Full Family Communication: Mother at bedside Disposition Plan: Status is: Inpatient Remains inpatient appropriate because: Of severity of illness    Consultants: None  Procedures: None  Antimicrobials: None   Subjective: Patient seen and examined at bedside.  No chest pain, fever, worsening shortness of breath reported.  Still complains of intermittent severe abdominal pain requiring IV Dilaudid with nausea and vomiting. Objective: Vitals:   08/21/22 1348 08/21/22 2104 08/22/22 0559 08/22/22 0617  BP: 124/87 123/76 (!) 143/91   Pulse: 63 65 (!) 57   Resp:  18 18   Temp: 97.7 F (36.5 C) 98.9 F (37.2 C) 98.6 F (37 C)   TempSrc: Oral Oral Oral   SpO2: 100% 100% 95%   Weight:    113.8 kg  Height:        Intake/Output Summary (Last 24 hours) at 08/22/2022 0757 Last data filed  at 08/22/2022 0617 Gross per 24 hour  Intake 2900 ml  Output 4001 ml  Net -1101 ml    Filed Weights   08/19/22 1752 08/21/22 1200 08/22/22 0617  Weight: 113.4 kg 117.2 kg 113.8 kg    Examination:  General: Currently on room air.  No distress.  respiratory: Decreased breath sounds at bases bilaterally with some crackles CVS: Mildly bradycardic intermittently; S1-S2 heard  abdominal: Soft, obese, tender in the upper quadrant, distended mildly, no organomegaly; bowel sounds heard normally extremities: Trace lower extremity edema; no clubbing.       Data Reviewed: I have personally reviewed following labs and imaging studies  CBC: Recent Labs  Lab 08/17/22 1517 08/20/22 0417 08/22/22 0434  WBC 12.7* 7.4 6.6  NEUTROABS  --   --  3.6  HGB 10.5* 9.7* 9.3*  HCT 34.8* 33.5* 32.0*  MCV 69.7* 71.6* 71.6*  PLT 356 282 281    Basic Metabolic Panel: Recent Labs  Lab 08/17/22 1517 08/20/22 0417 08/21/22 0532 08/22/22 0434  NA 140 139 137 136  K 3.6 3.8 3.3* 4.0  CL 105 105 105 105  CO2 26 28 26 27   GLUCOSE 88 97 97 97  BUN 10 10 7  <5*  CREATININE 0.78 0.69 0.66 0.70  CALCIUM 9.5 9.0 8.5* 8.7*  MG  --  2.1 1.9 1.9  PHOS  --  4.4  --   --     GFR: Estimated Creatinine Clearance: 120.5 mL/min (by C-G formula based on SCr of 0.7 mg/dL). Liver Function Tests: Recent Labs  Lab 08/17/22 1517 08/20/22 0417 08/21/22  0532 08/22/22 0434  AST 13* 179* 121* 94*  ALT 11 113* 119* 120*  ALKPHOS 69 82 83 91  BILITOT 0.4 1.6* 0.9 0.8  PROT 7.5 6.9 6.5 6.7  ALBUMIN 4.0 3.7 3.5 3.6    Recent Labs  Lab 08/17/22 1517 08/20/22 0417  LIPASE 236* 48    No results for input(s): "AMMONIA" in the last 168 hours. Coagulation Profile: No results for input(s): "INR", "PROTIME" in the last 168 hours. Cardiac Enzymes: No results for input(s): "CKTOTAL", "CKMB", "CKMBINDEX", "TROPONINI" in the last 168 hours. BNP (last 3 results) No results for input(s): "PROBNP" in the last  8760 hours. HbA1C: No results for input(s): "HGBA1C" in the last 72 hours. CBG: No results for input(s): "GLUCAP" in the last 168 hours. Lipid Profile: Recent Labs    08/20/22 0417  TRIG 120    Thyroid Function Tests: No results for input(s): "TSH", "T4TOTAL", "FREET4", "T3FREE", "THYROIDAB" in the last 72 hours. Anemia Panel: No results for input(s): "VITAMINB12", "FOLATE", "FERRITIN", "TIBC", "IRON", "RETICCTPCT" in the last 72 hours. Sepsis Labs: No results for input(s): "PROCALCITON", "LATICACIDVEN" in the last 168 hours.  No results found for this or any previous visit (from the past 240 hour(s)).       Radiology Studies: No results found.      Scheduled Meds:  enoxaparin (LOVENOX) injection  40 mg Subcutaneous Q24H   nicotine  14 mg Transdermal Daily   Continuous Infusions:  lactated ringers 100 mL/hr at 08/22/22 0100          Glade Lloyd, MD [facility] 08/22/2022, 7:57 AM

## 2022-08-23 DIAGNOSIS — K859 Acute pancreatitis without necrosis or infection, unspecified: Secondary | ICD-10-CM | POA: Diagnosis not present

## 2022-08-23 LAB — COMPREHENSIVE METABOLIC PANEL
ALT: 82 U/L — ABNORMAL HIGH (ref 0–44)
AST: 37 U/L (ref 15–41)
Albumin: 3.4 g/dL — ABNORMAL LOW (ref 3.5–5.0)
Alkaline Phosphatase: 78 U/L (ref 38–126)
Anion gap: 6 (ref 5–15)
BUN: 6 mg/dL (ref 6–20)
CO2: 28 mmol/L (ref 22–32)
Calcium: 8.9 mg/dL (ref 8.9–10.3)
Chloride: 105 mmol/L (ref 98–111)
Creatinine, Ser: 0.73 mg/dL (ref 0.44–1.00)
GFR, Estimated: >60 mL/min (ref >60)
Glucose, Bld: 97 mg/dL (ref 70–99)
Potassium: 4.3 mmol/L (ref 3.5–5.1)
Sodium: 139 mmol/L (ref 135–145)
Total Bilirubin: 0.7 mg/dL (ref 0.3–1.2)
Total Protein: 6.7 g/dL (ref 6.5–8.1)

## 2022-08-23 LAB — MAGNESIUM: Magnesium: 2 mg/dL (ref 1.7–2.4)

## 2022-08-23 MED ORDER — ONDANSETRON HCL 4 MG/2ML IJ SOLN
4.0000 mg | Freq: Four times a day (QID) | INTRAMUSCULAR | Status: DC | PRN
  Administered 2022-08-23 – 2022-08-24 (×4): 4 mg via INTRAVENOUS
  Filled 2022-08-23 (×5): qty 2

## 2022-08-23 NOTE — TOC CM/SW Note (Signed)
  Transition of Care (TOC) Screening Note   Patient Details  Name: Michele Walker Date of Birth: [birth year]   Transition of Care [facility]) CM/SW Contact:    Amada Jupiter, LCSW Phone Number: 08/23/2022, 1:37 PM    Transition of Care Department [facility]) has reviewed patient and no TOC needs have been identified at this time. We will continue to monitor patient advancement through interdisciplinary progression rounds. If new patient transition needs arise, please place a TOC consult.

## 2022-08-23 NOTE — Progress Notes (Signed)
PROGRESS NOTE    Michele Walker  KQA:[id number] DOB: 06-03-1980 DOA: 08/19/2022 PCP: Alm Bustard, MD   Brief Narrative:  42 year old female with history of hepatitis C in remission, cholecystectomy presented with worsening epigastric pain radiating to the back.  On presentation, workup revealed acute pancreatitis.  She was started on IV fluids and analgesics.  Assessment & Plan:   Acute pancreatitis Elevated LFTs -Presented with abdominal pain and was found to have lipase of 236.  Subsequently, lipase has already improved to 48. -Currently on IV fluids and analgesics.  Diet was advanced to soft diet on 08/22/2022 but patient had few episodes of vomiting and hence she was put back on for liquid diet.  Continue around-the-clock and as needed antiemetics. -No CBD dilatation seen on CT scan done on 08/17/2022 -Triglycerides 123  -LFTs are still elevated but mild.  Repeat a.m. labs.  Hepatitis panel is positive for reactive hepatitis C virus antibody.   History of chronic hepatitis C -Status post treatment with Harvoni in 2017.  This is possibly the reason for positive hepatitis C virus antibody.  Outpatient follow-up with GI/hepatology  Hypokalemia -Improved  Leukocytosis -Resolved  Chronic microcytic anemia -Hemoglobin stable.  Monitor intermittently  Obesity -Outpatient follow-up  Ongoing tobacco use -Counseling provided by admitting hospitalist.  Continue nicotine patch   DVT prophylaxis: Subcutaneous Lovenox Code Status: Full Family Communication: Mother at bedside Disposition Plan: Status is: Inpatient Remains inpatient appropriate because: Of severity of illness    Consultants: None  Procedures: None  Antimicrobials: None   Subjective: Patient seen and examined at bedside.  Had few episodes of vomiting after soft diet yesterday and hence diet was changed back to full liquid diet.  Still complains of intermittent abdominal pain with some nausea but feels better and  wants to try soft diet again today.  No fever or chest pain reported.   Objective: Vitals:   08/22/22 0617 08/22/22 2106 08/23/22 0501 08/23/22 0544  BP:  130/77  (!) 142/93  Pulse:  79  71  Resp:  18  18  Temp:  98.6 F (37 C)  98.3 F (36.8 C)  TempSrc:  Oral  Oral  SpO2:  100%  98%  Weight: 113.8 kg  115 kg   Height:        Intake/Output Summary (Last 24 hours) at 08/23/2022 0728 Last data filed at 08/23/2022 7943 Gross per 24 hour  Intake 3537.3 ml  Output 4003 ml  Net -465.7 ml    Filed Weights   08/21/22 1200 08/22/22 0617 08/23/22 0501  Weight: 117.2 kg 113.8 kg 115 kg    Examination:  General: No acute distress.  On room air currently.   Respiratory: Bilateral decreased breath sounds at bases with scattered crackles  CVS: S1 and S2 heard; rate controlled currently abdominal: Soft, obese, upper quadrant tenderness present, still has some distention, no organomegaly; normal bowel sounds heard extremities: No cyanosis; mild lower extremity edema present     Data Reviewed: I have personally reviewed following labs and imaging studies  CBC: Recent Labs  Lab 08/17/22 1517 08/20/22 0417 08/22/22 0434  WBC 12.7* 7.4 6.6  NEUTROABS  --   --  3.6  HGB 10.5* 9.7* 9.3*  HCT 34.8* 33.5* 32.0*  MCV 69.7* 71.6* 71.6*  PLT 356 282 281    Basic Metabolic Panel: Recent Labs  Lab 08/17/22 1517 08/20/22 0417 08/21/22 0532 08/22/22 0434 08/23/22 0418  NA 140 139 137 136 139  K 3.6 3.8 3.3* 4.0 4.3  CL 105 105 105 105 105  CO2 26 28 26 27 28   GLUCOSE 88 97 97 97 97  BUN 10 10 7  <5* 6  CREATININE 0.78 0.69 0.66 0.70 0.73  CALCIUM 9.5 9.0 8.5* 8.7* 8.9  MG  --  2.1 1.9 1.9 2.0  PHOS  --  4.4  --   --   --     GFR: Estimated Creatinine Clearance: 121.3 mL/min (by C-G formula based on SCr of 0.73 mg/dL). Liver Function Tests: Recent Labs  Lab 08/17/22 1517 08/20/22 0417 08/21/22 0532 08/22/22 0434 08/23/22 0418  AST 13* 179* 121* 94* 37  ALT 11 113*  119* 120* 82*  ALKPHOS 69 82 83 91 78  BILITOT 0.4 1.6* 0.9 0.8 0.7  PROT 7.5 6.9 6.5 6.7 6.7  ALBUMIN 4.0 3.7 3.5 3.6 3.4*    Recent Labs  Lab 08/17/22 1517 08/20/22 0417  LIPASE 236* 48    No results for input(s): "AMMONIA" in the last 168 hours. Coagulation Profile: No results for input(s): "INR", "PROTIME" in the last 168 hours. Cardiac Enzymes: No results for input(s): "CKTOTAL", "CKMB", "CKMBINDEX", "TROPONINI" in the last 168 hours. BNP (last 3 results) No results for input(s): "PROBNP" in the last 8760 hours. HbA1C: No results for input(s): "HGBA1C" in the last 72 hours. CBG: No results for input(s): "GLUCAP" in the last 168 hours. Lipid Profile: No results for input(s): "CHOL", "HDL", "LDLCALC", "TRIG", "CHOLHDL", "LDLDIRECT" in the last 72 hours.  Thyroid Function Tests: No results for input(s): "TSH", "T4TOTAL", "FREET4", "T3FREE", "THYROIDAB" in the last 72 hours. Anemia Panel: No results for input(s): "VITAMINB12", "FOLATE", "FERRITIN", "TIBC", "IRON", "RETICCTPCT" in the last 72 hours. Sepsis Labs: No results for input(s): "PROCALCITON", "LATICACIDVEN" in the last 168 hours.  No results found for this or any previous visit (from the past 240 hour(s)).       Radiology Studies: No results found.      Scheduled Meds:  enoxaparin (LOVENOX) injection  40 mg Subcutaneous Q24H   nicotine  14 mg Transdermal Daily   ondansetron (ZOFRAN) IV  4 mg Intravenous Q6H   pantoprazole (PROTONIX) IV  40 mg Intravenous Q12H   Continuous Infusions:  lactated ringers 100 mL/hr at 08/23/22 0521          Glade Lloyd, MD [facility] 08/23/2022, 7:28 AM

## 2022-08-24 DIAGNOSIS — K859 Acute pancreatitis without necrosis or infection, unspecified: Secondary | ICD-10-CM | POA: Diagnosis not present

## 2022-08-24 LAB — COMPREHENSIVE METABOLIC PANEL
ALT: 55 U/L — ABNORMAL HIGH (ref 0–44)
AST: 23 U/L (ref 15–41)
Albumin: 3.4 g/dL — ABNORMAL LOW (ref 3.5–5.0)
Alkaline Phosphatase: 70 U/L (ref 38–126)
Anion gap: 6 (ref 5–15)
BUN: 10 mg/dL (ref 6–20)
CO2: 29 mmol/L (ref 22–32)
Calcium: 8.7 mg/dL — ABNORMAL LOW (ref 8.9–10.3)
Chloride: 106 mmol/L (ref 98–111)
Creatinine, Ser: 0.88 mg/dL (ref 0.44–1.00)
GFR, Estimated: >60 mL/min (ref >60)
Glucose, Bld: 90 mg/dL (ref 70–99)
Potassium: 4.6 mmol/L (ref 3.5–5.1)
Sodium: 141 mmol/L (ref 135–145)
Total Bilirubin: 0.6 mg/dL (ref 0.3–1.2)
Total Protein: 6.8 g/dL (ref 6.5–8.1)

## 2022-08-24 LAB — MAGNESIUM: Magnesium: 2.1 mg/dL (ref 1.7–2.4)

## 2022-08-24 MED ORDER — METHOCARBAMOL 500 MG PO TABS: 500.0000 mg | ORAL_TABLET | Freq: Four times a day (QID) | ORAL | 0 refills | Status: AC | PRN

## 2022-08-24 MED ORDER — ONDANSETRON HCL 4 MG PO TABS: 4.0000 mg | ORAL_TABLET | Freq: Three times a day (TID) | ORAL | 0 refills | Status: AC | PRN

## 2022-08-24 MED ORDER — BISACODYL 10 MG RE SUPP
10.0000 mg | Freq: Every day | RECTAL | Status: DC | PRN
  Administered 2022-08-24: 10 mg via RECTAL
  Filled 2022-08-24: qty 1

## 2022-08-24 MED ORDER — SENNOSIDES-DOCUSATE SODIUM 8.6-50 MG PO TABS: 1.0000 | ORAL_TABLET | Freq: Two times a day (BID) | ORAL | 0 refills | Status: AC

## 2022-08-24 MED ORDER — POLYETHYLENE GLYCOL 3350 17 G PO PACK
17.0000 g | PACK | Freq: Every day | ORAL | Status: DC | PRN
  Administered 2022-08-24: 17 g via ORAL
  Filled 2022-08-24: qty 1

## 2022-08-24 MED ORDER — SENNOSIDES-DOCUSATE SODIUM 8.6-50 MG PO TABS
1.0000 | ORAL_TABLET | Freq: Two times a day (BID) | ORAL | Status: DC
  Administered 2022-08-24: 1 via ORAL
  Filled 2022-08-24: qty 1

## 2022-08-24 MED ORDER — POLYETHYLENE GLYCOL 3350 17 G PO PACK: 17.0000 g | PACK | Freq: Every day | ORAL | 0 refills | Status: AC | PRN

## 2022-08-24 MED ORDER — OMEPRAZOLE 40 MG PO CPDR: 40.0000 mg | DELAYED_RELEASE_CAPSULE | Freq: Every day | ORAL | 0 refills | Status: AC

## 2022-08-24 MED ORDER — ALUM & MAG HYDROXIDE-SIMETH 200-200-20 MG/5ML PO SUSP: 30.0000 mL | ORAL | 0 refills | Status: AC | PRN

## 2022-08-24 MED ORDER — OXYCODONE HCL 5 MG PO TABS: 5.0000 mg | ORAL_TABLET | Freq: Four times a day (QID) | ORAL | 0 refills | Status: AC | PRN

## 2022-08-24 NOTE — TOC Progression Note (Signed)
Transition of Care [facility]) - Progression Note    Patient Details  Name: Michele Walker MRN: [id number] Date of Birth: Dec 19, 1980  [facility]) CM/SW Contact  Geni Bers, RN Phone Number: 08/24/2022, 8:32 AM  Clinical Narrative:      Transition of Care (TOC) Screening Note   Patient Details  Name: Michele Walker Date of Birth: 1981/03/17   Transition of Care [facility]) CM/SW Contact:    Geni Bers, RN Phone Number: 08/24/2022, 8:33 AM    Transition of Care Department [facility]) has reviewed patient and no TOC needs have been identified at this time. We will continue to monitor patient advancement through interdisciplinary progression rounds. If new patient transition needs arise, please place a TOC consult.         Expected Discharge Plan and Services                                               Social Determinants of Health (SDOH) Interventions SDOH Screenings   Food Insecurity: Patient Declined (08/22/2022)  Housing: Low Risk  (08/22/2022)  Transportation Needs: No Transportation Needs (08/22/2022)  Utilities: Not At Risk (08/22/2022)  Tobacco Use: High Risk (08/19/2022)    Readmission Risk Interventions     No data to display

## 2022-08-24 NOTE — Discharge Summary (Signed)
Physician Discharge Summary  Michele Walker TOI:[id number] DOB: 02-24-1981 DOA: 08/19/2022  PCP: Alm Bustard, MD  Admit date: 08/19/2022 Discharge date: 08/24/2022  Admitted From: Home Disposition: Home  Recommendations for Outpatient Follow-up:  Follow up with PCP in 1 week with repeat CMP Follow up in ED if symptoms worsen or new appear   Home Health: No Equipment/Devices: None  Discharge Condition: Stable CODE STATUS: Full Diet recommendation: Soft diet  Brief/Interim Summary: 42 year old female with history of hepatitis C in remission, cholecystectomy presented with worsening epigastric pain radiating to the back. On presentation, workup revealed acute pancreatitis. She was started on IV fluids and analgesics.  During the hospitalization, her condition has gradually improved.  Her diet has been gradually advanced and she is currently tolerating diet and wants to go home today.  She will be discharged home today with outpatient follow-up with PCP.  Discharge Diagnoses:   Acute pancreatitis Elevated LFTs -Presented with abdominal pain and was found to have lipase of 236.  Subsequently, lipase has already improved to 48. -Currently on IV fluids and analgesics.  Diet was advanced back to soft diet on 08/23/2022; currently tolerating diet and wants to go home. Feels constipated: patient can try senokot and as needed miralax on discharge.  -No CBD dilatation seen on CT scan done on 08/17/2022 -Triglycerides 123  -LFTs are trending down.  Outpatient follow up of LFTs  -Discharge patient home today. Outpatient follow up with PCP  History of chronic hepatitis C -Status post treatment with Harvoni in 2017.  This is possibly the reason for positive hepatitis C virus antibody.  Outpatient follow-up with GI/hepatology -Currently also on IV Protonix.  Discharged home on oral omeprazole   Hypokalemia -Improved   Leukocytosis -Resolved   Chronic microcytic anemia -Hemoglobin stable.  Monitor  intermittently as an outpatient   Obesity -Outpatient follow-up   Ongoing tobacco use -Counseling provided by admitting hospitalist.  Outpatient follow-up with PCP.   Discharge Instructions  Discharge Instructions     Diet general   Complete by: As directed    Soft diet   Increase activity slowly   Complete by: As directed       Allergies as of 08/24/2022       Reactions   Morphine And Related    Upper stomach pain   Penicillins    rash        Medication List     STOP taking these medications    HYDROcodone-acetaminophen 5-325 MG tablet Commonly known as: Norco   ibuprofen 800 MG tablet Commonly known as: ADVIL       TAKE these medications    ALPRAZolam 0.5 MG tablet Commonly known as: XANAX Take 0.5 mg by mouth daily as needed for anxiety or sleep.   alum & mag hydroxide-simeth 200-200-20 MG/5ML suspension Commonly known as: MAALOX/MYLANTA Take 30 mLs by mouth every 4 (four) hours as needed for indigestion or heartburn.   Effexor XR 150 MG 24 hr capsule Generic drug: venlafaxine XR Take 150 mg by mouth daily.   methocarbamol 500 MG tablet Commonly known as: ROBAXIN Take 1 tablet (500 mg total) by mouth every 6 (six) hours as needed for muscle spasms (back pain).   omeprazole 40 MG capsule Commonly known as: PRILOSEC Take 1 capsule (40 mg total) by mouth daily. Start taking on: August 25, 2022   ondansetron 4 MG tablet Commonly known as: ZOFRAN Take 1 tablet (4 mg total) by mouth every 8 (eight) hours as needed for nausea or  vomiting.   oxyCODONE 5 MG immediate release tablet Commonly known as: Oxy IR/ROXICODONE Take 1 tablet (5 mg total) by mouth every 6 (six) hours as needed for moderate pain or severe pain.   polyethylene glycol 17 g packet Commonly known as: MIRALAX / GLYCOLAX Take 17 g by mouth daily as needed for moderate constipation.   senna-docusate 8.6-50 MG tablet Commonly known as: Senokot-S Take 1 tablet by mouth 2 (two)  times daily.        Follow-up Information     Alm Bustard, MD. Schedule an appointment as soon as possible for a visit in 1 week(s).   Specialty: Obstetrics and Gynecology Why: with repeat cmp Contact information: [address] [place] [address] [phone]                Allergies  Allergen Reactions   Morphine And Related     Upper stomach pain   Penicillins     rash    Consultations: None   Procedures/Studies: CT Abdomen Pelvis W Contrast  Result Date: 08/17/2022 CLINICAL DATA:  Left abdominal pain into back in both legs. EXAM: CT ABDOMEN AND PELVIS WITH CONTRAST TECHNIQUE: Multidetector CT imaging of the abdomen and pelvis was performed using the standard protocol following bolus administration of intravenous contrast. RADIATION DOSE REDUCTION: This exam was performed according to the departmental dose-optimization program which includes automated exposure control, adjustment of the mA and/or kV according to patient size and/or use of iterative reconstruction technique. CONTRAST:  OMNIPAQUE IOHEXOL 300 MG/ML  SOLN COMPARISON:  CT 12/11/2012 FINDINGS: Lower chest: No acute abnormality. Hepatobiliary: No focal liver abnormality is seen. Status post cholecystectomy. No biliary dilatation. Pancreas: Unremarkable. No pancreatic ductal dilatation or surrounding inflammatory changes. Spleen: Unremarkable. Adrenals/Urinary Tract: Normal adrenal glands. Low-attenuation lesions in the kidneys are statistically likely to represent cysts. No follow-up is required. No urinary calculi or hydronephrosis. Unremarkable bladder. Stomach/Bowel: Normal caliber large and small bowel. Colonic diverticulosis without diverticulitis. No bowel wall thickening. Normal appendix. Unremarkable stomach. Vascular/Lymphatic: No significant vascular findings are present. No enlarged abdominal or pelvic lymph nodes. Reproductive: Uterus and bilateral adnexa are unremarkable. Other: Trace free  fluid in the pelvis is likely physiologic. No free intraperitoneal air. Musculoskeletal: No acute fracture. IMPRESSION: 1. No acute abnormality of the abdomen or pelvis. Electronically Signed   By: Minerva Fester M.D.   On: 08/17/2022 17:02      Subjective: Patient seen and examined at bedside.  Feels constipated. Tolerating soft diet. No fever reported.  Wants to go home today.  Discharge Exam: Vitals:   08/23/22 2009 08/24/22 0544  BP: 127/89 (!) 145/95  Pulse: 66 76  Resp: 18 18  Temp: 98.4 F (36.9 C) 97.9 F (36.6 C)  SpO2: 100% 100%    General: On room air. No distress. Respiratory: Decreased breath sounds at bases bilaterally, no wheezing  CVS: Rate mostly controlled; S1-S2 heard  abdominal: Soft, obese, mild upper quadrant tenderness present, still distended; no organomegaly; bowel sounds are heard  extremities: Trace lower extremity edema present; no clubbing    The results of significant diagnostics from this hospitalization (including imaging, microbiology, ancillary and laboratory) are listed below for reference.     Microbiology: No results found for this or any previous visit (from the past 240 hour(s)).   Labs: BNP (last 3 results) No results for input(s): "BNP" in the last 8760 hours. Basic Metabolic Panel: Recent Labs  Lab 08/20/22 0417 08/21/22 0532 08/22/22 0434 08/23/22 9147  08/24/22 0458  NA 139 137 136 139 141  K 3.8 3.3* 4.0 4.3 4.6  CL 105 105 105 105 106  CO2 28 26 27 28 29   GLUCOSE 97 97 97 97 90  BUN 10 7 <5* 6 10  CREATININE 0.69 0.66 0.70 0.73 0.88  CALCIUM 9.0 8.5* 8.7* 8.9 8.7*  MG 2.1 1.9 1.9 2.0 2.1  PHOS 4.4  --   --   --   --    Liver Function Tests: Recent Labs  Lab 08/20/22 0417 08/21/22 0532 08/22/22 0434 08/23/22 0418 08/24/22 0458  AST 179* 121* 94* 37 23  ALT 113* 119* 120* 82* 55*  ALKPHOS 82 83 91 78 70  BILITOT 1.6* 0.9 0.8 0.7 0.6  PROT 6.9 6.5 6.7 6.7 6.8  ALBUMIN 3.7 3.5 3.6 3.4* 3.4*   Recent Labs   Lab 08/17/22 1517 08/20/22 0417  LIPASE 236* 48   No results for input(s): "AMMONIA" in the last 168 hours. CBC: Recent Labs  Lab 08/17/22 1517 08/20/22 0417 08/22/22 0434  WBC 12.7* 7.4 6.6  NEUTROABS  --   --  3.6  HGB 10.5* 9.7* 9.3*  HCT 34.8* 33.5* 32.0*  MCV 69.7* 71.6* 71.6*  PLT 356 282 281   Cardiac Enzymes: No results for input(s): "CKTOTAL", "CKMB", "CKMBINDEX", "TROPONINI" in the last 168 hours. BNP: Invalid input(s): "POCBNP" CBG: No results for input(s): "GLUCAP" in the last 168 hours. D-Dimer No results for input(s): "DDIMER" in the last 72 hours. Hgb A1c No results for input(s): "HGBA1C" in the last 72 hours. Lipid Profile No results for input(s): "CHOL", "HDL", "LDLCALC", "TRIG", "CHOLHDL", "LDLDIRECT" in the last 72 hours. Thyroid function studies No results for input(s): "TSH", "T4TOTAL", "T3FREE", "THYROIDAB" in the last 72 hours.  Invalid input(s): "FREET3" Anemia work up No results for input(s): "VITAMINB12", "FOLATE", "FERRITIN", "TIBC", "IRON", "RETICCTPCT" in the last 72 hours. Urinalysis    Component Value Date/Time   COLORURINE AMBER (A) 11/21/2014 1920   APPEARANCEUR CLOUDY (A) 11/21/2014 1920   LABSPEC 1.019 11/21/2014 1920   PHURINE 6.0 11/21/2014 1920   GLUCOSEU NEGATIVE 11/21/2014 1920   HGBUR LARGE (A) 11/21/2014 1920   BILIRUBINUR NEGATIVE 11/21/2014 1920   KETONESUR NEGATIVE 11/21/2014 1920   PROTEINUR NEGATIVE 11/21/2014 1920   UROBILINOGEN 0.2 11/21/2014 1920   NITRITE NEGATIVE 11/21/2014 1920   LEUKOCYTESUR SMALL (A) 11/21/2014 1920   Sepsis Labs Recent Labs  Lab 08/17/22 1517 08/20/22 0417 08/22/22 0434  WBC 12.7* 7.4 6.6   Microbiology No results found for this or any previous visit (from the past 240 hour(s)).   Time coordinating discharge: 35 minutes  SIGNED:   Glade LloydKshitiz Brienna Bass, MD  [facility] 08/24/2022, 12:38 PM

## 2022-08-24 NOTE — Progress Notes (Signed)
PROGRESS NOTE    Michele Walker  NBV:[id number] DOB: Oct 02, 1980 DOA: 08/19/2022 PCP: Alm Bustard, MD   Brief Narrative:  42 year old female with history of hepatitis C in remission, cholecystectomy presented with worsening epigastric pain radiating to the back.  On presentation, workup revealed acute pancreatitis.  She was started on IV fluids and analgesics.  Assessment & Plan:   Acute pancreatitis Elevated LFTs -Presented with abdominal pain and was found to have lipase of 236.  Subsequently, lipase has already improved to 48. -Currently on IV fluids and analgesics.  Diet was advanced back to soft diet on 08/23/2022; complains of severe abdominal pain this morning; attributes to constipation. Will add bowel regimen.  Continue as needed antiemetics. -No CBD dilatation seen on CT scan done on 08/17/2022 -Triglycerides 123  -LFTs are trending down.  Repeat a.m. labs.    History of chronic hepatitis C -Status post treatment with Harvoni in 2017.  This is possibly the reason for positive hepatitis C virus antibody.  Outpatient follow-up with GI/hepatology  Hypokalemia -Improved  Leukocytosis -Resolved  Chronic microcytic anemia -Hemoglobin stable.  Monitor intermittently  Obesity -Outpatient follow-up  Ongoing tobacco use -Counseling provided by admitting hospitalist.  Continue nicotine patch   DVT prophylaxis: Subcutaneous Lovenox Code Status: Full Family Communication: Husband at bedside Disposition Plan: Status is: Inpatient Remains inpatient appropriate because: Of severity of illness    Consultants: None  Procedures: None  Antimicrobials: None   Subjective: Patient seen and examined at bedside.  Complains of severe abdominal pain this morning. Feels constipated. Tolerating soft diet. No fever reported. Objective: Vitals:   08/23/22 1227 08/23/22 2009 08/24/22 0500 08/24/22 0544  BP: 123/86 127/89  (!) 145/95  Pulse: 85 66  76  Resp: 18 18  18   Temp: (!) 97.5  F (36.4 C) 98.4 F (36.9 C)  97.9 F (36.6 C)  TempSrc:  Oral  Oral  SpO2: 100% 100%  100%  Weight:   119.4 kg   Height:        Intake/Output Summary (Last 24 hours) at 08/24/2022 1219 Last data filed at 08/24/2022 0948 Gross per 24 hour  Intake 3043.47 ml  Output 3300 ml  Net -256.53 ml    Filed Weights   08/22/22 0617 08/23/22 0501 08/24/22 0500  Weight: 113.8 kg 115 kg 119.4 kg    Examination:  General: On room air. No distress. Respiratory: Decreased breath sounds at bases bilaterally, no wheezing  CVS: Rate mostly controlled; S1-S2 heard  abdominal: Soft, obese, mild upper quadrant tenderness present, still distended; no organomegaly; bowel sounds are heard  extremities: Trace lower extremity edema present; no clubbing  Data Reviewed: I have personally reviewed following labs and imaging studies  CBC: Recent Labs  Lab 08/17/22 1517 08/20/22 0417 08/22/22 0434  WBC 12.7* 7.4 6.6  NEUTROABS  --   --  3.6  HGB 10.5* 9.7* 9.3*  HCT 34.8* 33.5* 32.0*  MCV 69.7* 71.6* 71.6*  PLT 356 282 281    Basic Metabolic Panel: Recent Labs  Lab 08/20/22 0417 08/21/22 0532 08/22/22 0434 08/23/22 0418 08/24/22 0458  NA 139 137 136 139 141  K 3.8 3.3* 4.0 4.3 4.6  CL 105 105 105 105 106  CO2 28 26 27 28 29   GLUCOSE 97 97 97 97 90  BUN 10 7 <5* 6 10  CREATININE 0.69 0.66 0.70 0.73 0.88  CALCIUM 9.0 8.5* 8.7* 8.9 8.7*  MG 2.1 1.9 1.9 2.0 2.1  PHOS 4.4  --   --   --   --  GFR: Estimated Creatinine Clearance: 112.5 mL/min (by C-G formula based on SCr of 0.88 mg/dL). Liver Function Tests: Recent Labs  Lab 08/20/22 0417 08/21/22 0532 08/22/22 0434 08/23/22 0418 08/24/22 0458  AST 179* 121* 94* 37 23  ALT 113* 119* 120* 82* 55*  ALKPHOS 82 83 91 78 70  BILITOT 1.6* 0.9 0.8 0.7 0.6  PROT 6.9 6.5 6.7 6.7 6.8  ALBUMIN 3.7 3.5 3.6 3.4* 3.4*    Recent Labs  Lab 08/17/22 1517 08/20/22 0417  LIPASE 236* 48    No results for input(s): "AMMONIA" in the last  168 hours. Coagulation Profile: No results for input(s): "INR", "PROTIME" in the last 168 hours. Cardiac Enzymes: No results for input(s): "CKTOTAL", "CKMB", "CKMBINDEX", "TROPONINI" in the last 168 hours. BNP (last 3 results) No results for input(s): "PROBNP" in the last 8760 hours. HbA1C: No results for input(s): "HGBA1C" in the last 72 hours. CBG: No results for input(s): "GLUCAP" in the last 168 hours. Lipid Profile: No results for input(s): "CHOL", "HDL", "LDLCALC", "TRIG", "CHOLHDL", "LDLDIRECT" in the last 72 hours.  Thyroid Function Tests: No results for input(s): "TSH", "T4TOTAL", "FREET4", "T3FREE", "THYROIDAB" in the last 72 hours. Anemia Panel: No results for input(s): "VITAMINB12", "FOLATE", "FERRITIN", "TIBC", "IRON", "RETICCTPCT" in the last 72 hours. Sepsis Labs: No results for input(s): "PROCALCITON", "LATICACIDVEN" in the last 168 hours.  No results found for this or any previous visit (from the past 240 hour(s)).       Radiology Studies: No results found.      Scheduled Meds:  enoxaparin (LOVENOX) injection  40 mg Subcutaneous Q24H   nicotine  14 mg Transdermal Daily   pantoprazole (PROTONIX) IV  40 mg Intravenous Q12H   senna-docusate  1 tablet Oral BID   Continuous Infusions:  lactated ringers 75 mL/hr at 08/23/22 1000          Glade Lloyd, MD [facility] 08/24/2022, 12:19 PM

## 2022-08-24 NOTE — Plan of Care (Signed)
  Problem: Health Behavior/Discharge Planning: Goal: Ability to manage health-related needs will improve Outcome: Progressing   Problem: Clinical Measurements: Goal: Ability to maintain clinical measurements within normal limits will improve Outcome: Progressing   Problem: Clinical Measurements: Goal: Will remain free from infection Outcome: Progressing   

## 2023-02-23 ENCOUNTER — Encounter (HOSPITAL_BASED_OUTPATIENT_CLINIC_OR_DEPARTMENT_OTHER): Payer: Self-pay

## 2023-02-23 ENCOUNTER — Emergency Department (HOSPITAL_BASED_OUTPATIENT_CLINIC_OR_DEPARTMENT_OTHER): Payer: BC Managed Care – PPO | Admitting: Radiology

## 2023-02-23 DIAGNOSIS — R7309 Other abnormal glucose: Secondary | ICD-10-CM | POA: Insufficient documentation

## 2023-02-23 DIAGNOSIS — R079 Chest pain, unspecified: Secondary | ICD-10-CM | POA: Diagnosis present

## 2023-02-23 DIAGNOSIS — D509 Iron deficiency anemia, unspecified: Secondary | ICD-10-CM | POA: Insufficient documentation

## 2023-02-23 DIAGNOSIS — F17210 Nicotine dependence, cigarettes, uncomplicated: Secondary | ICD-10-CM | POA: Diagnosis not present

## 2023-02-23 LAB — BASIC METABOLIC PANEL
Anion gap: 8 (ref 5–15)
BUN: 11 mg/dL (ref 6–20)
CO2: 28 mmol/L (ref 22–32)
Calcium: 9.5 mg/dL (ref 8.9–10.3)
Chloride: 103 mmol/L (ref 98–111)
Creatinine, Ser: 0.6 mg/dL (ref 0.44–1.00)
GFR, Estimated: >60 mL/min (ref >60)
Glucose, Bld: 117 mg/dL — ABNORMAL HIGH (ref 70–99)
Potassium: 3.5 mmol/L (ref 3.5–5.1)
Sodium: 139 mmol/L (ref 135–145)

## 2023-02-23 LAB — CBC
HCT: 35.2 % — ABNORMAL LOW (ref 36.0–46.0)
Hemoglobin: 10.5 g/dL — ABNORMAL LOW (ref 12.0–15.0)
MCH: 20.8 pg — ABNORMAL LOW (ref 26.0–34.0)
MCHC: 29.8 g/dL — ABNORMAL LOW (ref 30.0–36.0)
MCV: 69.8 fL — ABNORMAL LOW (ref 80.0–100.0)
Platelets: 353 10*3/uL (ref 150–400)
RBC: 5.04 MIL/uL (ref 3.87–5.11)
RDW: 17.5 % — ABNORMAL HIGH (ref 11.5–15.5)
WBC: 11.1 10*3/uL — ABNORMAL HIGH (ref 4.0–10.5)
nRBC: 0 % (ref 0.0–0.2)

## 2023-02-23 LAB — TROPONIN I (HIGH SENSITIVITY): Troponin I (High Sensitivity): 2 ng/L (ref <18)

## 2023-02-23 LAB — PREGNANCY, URINE: Preg Test, Ur: NEGATIVE

## 2023-02-23 NOTE — ED Triage Notes (Signed)
Pt states that she has been having CP on the L side all day, with SOB, hurts worse when she takes a deep breath. Pt states that she vomited x 1, Pt is very anxious in triage.

## 2023-02-24 ENCOUNTER — Emergency Department (HOSPITAL_BASED_OUTPATIENT_CLINIC_OR_DEPARTMENT_OTHER)
Admission: EM | Admit: 2023-02-24 | Discharge: 2023-02-24 | Disposition: A | Discharge status: Home / Self Care | Payer: BC Managed Care – PPO | Attending: Emergency Medicine | Admitting: Emergency Medicine

## 2023-02-24 DIAGNOSIS — D509 Iron deficiency anemia, unspecified: Secondary | ICD-10-CM

## 2023-02-24 DIAGNOSIS — R739 Hyperglycemia, unspecified: Secondary | ICD-10-CM

## 2023-02-24 DIAGNOSIS — R0781 Pleurodynia: Secondary | ICD-10-CM

## 2023-02-24 LAB — TROPONIN I (HIGH SENSITIVITY): Troponin I (High Sensitivity): 2 ng/L (ref <18)

## 2023-02-24 LAB — D-DIMER, QUANTITATIVE: D-Dimer, Quant: <0.27 ug{FEU}/mL (ref 0.00–0.50)

## 2023-02-24 MED ORDER — ONDANSETRON 4 MG PO TBDP
8.0000 mg | ORAL_TABLET | Freq: Once | ORAL | Status: AC
  Administered 2023-02-24: 8 mg via ORAL
  Filled 2023-02-24: qty 2

## 2023-02-24 MED ORDER — DEXAMETHASONE 4 MG PO TABS
10.0000 mg | ORAL_TABLET | Freq: Once | ORAL | Status: AC
  Administered 2023-02-24: 10 mg via ORAL
  Filled 2023-02-24: qty 3

## 2023-02-24 MED ORDER — IBUPROFEN 400 MG PO TABS
400.0000 mg | ORAL_TABLET | Freq: Once | ORAL | Status: AC
  Administered 2023-02-24: 400 mg via ORAL
  Filled 2023-02-24: qty 1

## 2023-02-24 NOTE — ED Provider Notes (Signed)
[place] EMERGENCY DEPARTMENT AT [facility] Provider Note   CSN: [id number] Arrival date & time: 02/23/23  2155     History  Chief Complaint  Patient presents with   Chest Pain    Michele Walker is a 42 y.o. female.  The history is provided by the patient.  Chest Pain She has history of hepatitis C, pancreatitis and comes in because of sharp left-sided chest pain which started this evening.  Pain radiates around to her back.  It is worse when she takes a deep breath.  She denies dyspnea.  There has been some nausea and she vomited once.  She did sweat but she thinks it was a hot flash rather than anything related to her pain.  She has never had pain like this before.  She is a cigarette smoker but denies history of hypertension or diabetes or hyperlipidemia.  There is no family history of coronary atherosclerosis.  She denies history of recent surgery or travel, denies history of prior DVT or pulmonary embolism, she is not on any exogenous estrogens.   Home Medications Prior to Admission medications   Medication Sig Start Date End Date Taking? Authorizing Provider  ALPRAZolam Prudy Feeler) 0.5 MG tablet Take 0.5 mg by mouth daily as needed for anxiety or sleep. 07/05/22   [provider]  alum & mag hydroxide-simeth (MAALOX/MYLANTA) 200-200-20 MG/5ML suspension Take 30 mLs by mouth every 4 (four) hours as needed for indigestion or heartburn. 08/24/22   Glade Lloyd, MD  methocarbamol (ROBAXIN) 500 MG tablet Take 1 tablet (500 mg total) by mouth every 6 (six) hours as needed for muscle spasms (back pain). 08/24/22   Glade Lloyd, MD  omeprazole (PRILOSEC) 40 MG capsule Take 1 capsule (40 mg total) by mouth daily. 08/25/22   Glade Lloyd, MD  ondansetron (ZOFRAN) 4 MG tablet Take 1 tablet (4 mg total) by mouth every 8 (eight) hours as needed for nausea or vomiting. 08/24/22   Glade Lloyd, MD  oxyCODONE (OXY IR/ROXICODONE) 5 MG immediate release tablet Take 1 tablet (5 mg  total) by mouth every 6 (six) hours as needed for moderate pain or severe pain. 08/24/22   Glade Lloyd, MD  polyethylene glycol (MIRALAX / GLYCOLAX) 17 g packet Take 17 g by mouth daily as needed for moderate constipation. 08/24/22   Glade Lloyd, MD  senna-docusate (SENOKOT-S) 8.6-50 MG tablet Take 1 tablet by mouth 2 (two) times daily. 08/24/22   Glade Lloyd, MD  venlafaxine XR (EFFEXOR XR) 150 MG 24 hr capsule Take 150 mg by mouth daily. 04/20/22   [provider]  ferrous sulfate 325 (65 FE) MG tablet Take 325 mg by mouth daily with breakfast.  04/02/14  [provider]  metoCLOPramide (REGLAN) 10 MG tablet Take 1 tablet (10 mg total) by mouth every 6 (six) hours. 08/25/13 04/02/14  Phillis Haggis, MD      Allergies    Morphine and codeine and Penicillins    Review of Systems   Review of Systems  Cardiovascular:  Positive for chest pain.  All other systems reviewed and are negative.   Physical Exam Updated Vital Signs BP (!) 153/107 (BP Location: Right Arm)   Pulse 97   Temp 97.9 F (36.6 C) (Oral)   Resp 17   SpO2 100%  Physical Exam Vitals and nursing note reviewed.   42 year old female, resting comfortably and in no acute distress. Vital signs are significant for elevated blood pressure. Oxygen saturation is 100%, which is  normal. Head is normocephalic and atraumatic. PERRLA, EOMI. Oropharynx is clear. Neck is nontender and supple without adenopathy or JVD. Back is nontender and there is no CVA tenderness. Lungs are clear without rales, wheezes, or rhonchi. Chest is nontender. Heart has regular rate and rhythm without murmur. Abdomen is soft, flat, nontender. Extremities have no cyanosis or edema, full range of motion is present. Skin is warm and dry without rash. Neurologic: Mental status is normal, cranial nerves are intact, moves all extremities equally.  ED Results / Procedures / Treatments   Labs (all labs ordered are listed, but only abnormal  results are displayed) Labs Reviewed  BASIC METABOLIC PANEL - Abnormal; Notable for the following components:      Result Value   Glucose, Bld 117 (*)    All other components within normal limits  CBC - Abnormal; Notable for the following components:   WBC 11.1 (*)    Hemoglobin 10.5 (*)    HCT 35.2 (*)    MCV 69.8 (*)    MCH 20.8 (*)    MCHC 29.8 (*)    RDW 17.5 (*)    All other components within normal limits  PREGNANCY, URINE  TROPONIN I (HIGH SENSITIVITY)  TROPONIN I (HIGH SENSITIVITY)    EKG EKG Interpretation Date/Time:  Wednesday February 23 2023 22:06:28 EDT Ventricular Rate:  89 PR Interval:  142 QRS Duration:  90 QT Interval:  360 QTC Calculation: 438 R Axis:   65  Text Interpretation: Normal sinus rhythm with sinus arrhythmia Nonspecific T wave abnormality Abnormal ECG When compared with ECG of 23-Aug-2022 13:58, No significant change was found Confirmed by Dione Booze ([id number]) on 02/24/2023 12:03:49 AM  Radiology DG Chest 2 View  Result Date: 02/23/2023 CLINICAL DATA:  Chest pain. EXAM: CHEST - 2 VIEW COMPARISON:  11/07/2013 FINDINGS: The cardiomediastinal contours are normal. The lungs are clear. Pulmonary vasculature is normal. No consolidation, pleural effusion, or pneumothorax. No acute osseous abnormalities are seen. IMPRESSION: No active cardiopulmonary disease. Electronically Signed   By: Narda Rutherford M.D.   On: 02/23/2023 23:28    Procedures Procedures  Cardiac monitor shows normal sinus rhythm, per my interpretation.  Medications Ordered in ED Medications  dexamethasone (DECADRON) tablet 10 mg (has no administration in time range)  ibuprofen (ADVIL) tablet 400 mg (400 mg Oral Given 02/24/23 0153)  ondansetron (ZOFRAN-ODT) disintegrating tablet 8 mg (8 mg Oral Given 02/24/23 0154)    ED Course/ Medical Decision Making/ A&P                                 Medical Decision Making Amount and/or Complexity of Data Reviewed Labs:  ordered. Radiology: ordered.  Risk Prescription drug management.   Left-sided chest pain, etiology unclear.  Differential diagnosis includes ACS, pulmonary embolism, musculoskeletal pain, pericarditis, pleurisy, pneumonia.  I have reviewed her electrocardiogram, and my interpretation is nonspecific T wave changes unchanged from prior.  Chest x-ray shows no active cardiopulmonary disease.  Have independently viewed the images, and agree with the radiologist's interpretation.  I have reviewed her laboratory tests, and my interpretation is stable anemia, mild leukocytosis which is nonspecific, normal basic metabolic panel and normal initial troponin with repeat troponin pending.  I have ordered a D-dimer to rule out pulmonary embolism.  Heart score is 2, which puts her at low risk for major adverse cardiac events in the next 6 weeks.  PERC pulmonary embolism screening is 0 and  she [place] revised score is low risk for pulmonary embolism.  However, symptoms are fairly typical so I have ordered a D-dimer to rule out pulmonary embolism.  I have also ordered a dose of ibuprofen as well as ondansetron oral dissolving tablet.  I have reviewed her past records, and she has no relevant past visits.  Repeat troponin is normal, D-dimer is normal.  No evidence of ACS, pulmonary embolism.  I have discussed the findings with the patient, she is relieved that there is not a serious underlying pathology.  She had moderate relief with ibuprofen.  I have recommended that she combine ibuprofen and acetaminophen, but she states that she is very reluctant to take acetaminophen because of her history of hepatitis C.  At this point, my working diagnosis is viral pleurisy, I have ordered a single dose of dexamethasone.  I have counseled her on the need to stop smoking and recommended techniques to assist her in quitting.  Final Clinical Impression(s) / ED Diagnoses Final diagnoses:  Pleuritic chest pain  Microcytic anemia   Elevated random blood glucose level    Rx / DC Orders ED Discharge Orders     None         Dione Booze, MD 02/24/23 [phone]

## 2023-02-24 NOTE — Discharge Instructions (Addendum)
Your evaluation today did not show any serious problem.  Specifically, there is no evidence of a heart attack, pneumonia, blood clot in your lungs.  You may take ibuprofen and/or acetaminophen as needed for pain.  Please be aware that if you combine ibuprofen and acetaminophen, you will get better pain relief and you get from taking either medication by itself.  Although cigarette smoking did not cause your pain today, it will be very good for your health if you are able to stop smoking.  Return if you have any new or concerning symptoms.

## 2024-02-14 NOTE — Progress Notes (Signed)
 [facility] [place] NEW PATIENT EVALUATION   Referring Physician:  Delores Tinnie NOVAK, FNP Primary Care Physician:  Victory VEAR Buss, MD   Patient ID:  Michele Walker is a 43 y.o. (DOB [birth year]) female.    Subjective   HPI:  Michele Walker is a 43 y.o. female referred for headaches.  Patient reports symptoms began approximately 2 months ago and progressively worsening since onset.  Patient reports a history of Bell's palsy.  Patient has a history of migraines that occurred approximately 10 years ago.  She denies any known family history of headaches or migraines.  Patient states that she initially started feeling as though she was getting  goose eggs in the occipital region of her head.  She has recently been to urgent care for evaluation who was not able to find any abnormalities.  Patient states she is having a pressure in her head on a near daily basis.  Symptoms will begin in the morning and occurs 5 out of 7 days of the week. Head is tender to touch.  She endorses some blurry vision, lightheadedness, dizziness, phonophobia, and brain fog.  She does endorse some nausea but feels that this is secondary to pain and not due to her symptoms.  Patient has had an eye exam approximately 1 to 2 years ago.  She is currently on Effexor for psychiatric conditions and is working on titrating off of the Effexor.  Patient denies any complicating neurological features associated with her symptoms.  Mental Health: Stress and anxiety levels are high.  Mood is stable Sleep Quality: Poor Exercise: 0 days per week  Caffeine use: 800 mg caffeine per day and coffee, tea, energy drinks Alcohol Use: Denies  Tobacco Use: 3/4 PPD Vaping: No Artificial Sweetener Use: No Use of Illicit drugs: Cannabis  CURRENT AND PAST MEDICATIONS:  Analgesics:   Anti-migraine: Imitrex, Maxalt  Heart/bp:    Decongestant/ antihistamine:   Anti-nauseant: Endocrine  Nsaids: Ibuprofen   Muscle relaxants:    Anti-convulsants: Topamax  Steroids:   Sleeping pills/ tranquilizers:   Anti-depressants:   Herbal:    Fibromyalgia:    Hormonal:   Other:   Procedures for headaches:     Past Medical History, Past Surgery History, Social History, and Family History were reviewed and updated.    Past Medical History:  Diagnosis Date   Hep C w/o coma, chronic (*)     Past Surgical History:  Procedure Laterality Date   Cholecystectomy     Repeat cesarean section     Tubal ligation     No family history on file. Social History[1]    Current Home Medications  Medication Sig  albuterol sulfate HFA (PROVENTIL,VENTOLIN,PROAIR) 108 (90 Base) MCG/ACT inhaler Inhale two puffs into the lungs every 6 (six) hours as needed. Patient not taking: Reported on 02/14/2024  ibuprofen  (ADVIL ,MOTRIN ) 800 mg tablet Take 800 mg by mouth 3 (three) times a day. Patient not taking: Reported on 02/14/2024  promethazine  (PHENERGAN ) 25 MG tablet Take one tablet (25 mg dose) by mouth every 8 (eight) hours as needed for Nausea. Patient not taking: Reported on 02/14/2024  promethazine -dextromethorphan (PROMETHAZINE -DM) 6.25-15 MG/5ML syrup Take 5 mLs by mouth 3 (three) times a day as needed for Cough. Patient not taking: Reported on 02/14/2024  rizatriptan (MAXALT) 10 MG tablet 1 tab by mouth at the onset of a headache.  May repeat in 2 hours if needed.  Do not exceed more than two doses in a 24 hour period.  topiramate (  TOPAMAX) 50 MG tablet 1 tab by mouth at bedtime for 1 week, then 1 tab twice a day  venlafaxine HCl (EFFEXOR-XR) 150 mg 24 hr capsule Take one capsule (150 mg dose) by mouth daily.    The patient is allergic to penicillins, morphine , and morphine  and related.  Review of Systems is complete and negative except as noted in History of Present Illness.  Objective    PHYSICAL EXAM: BP (!) 139/98 (BP Location: Right Upper Arm)   Pulse 90   Ht 5' 7 (1.702 m)   Wt 274 lb 3.2 oz (124.4 kg)   SpO2 96%    BMI 42.95 kg/m  GENERAL: Awake, alert, in no acute distress Psych: Affect appropriate for situation, patient is calm and cooperative with examination Head: Normocephalic and atraumatic, without obvious abnormality EENT: Normal conjunctivae, dry mucous membranes, no OP obstruction LUNGS: Normal respiratory effort. Non-labored breathing on room air. Clear to auscultation bilaterally. CV: Auscultation: regular rate and rhythm. Extremities: Warm, well perfused, without obvious deformity  MENTAL STATUS EXAM: Orientation: Alert and oriented to person, place and time. Memory: Cooperative, follows commands well. Recent and remote memory normal.. Attention, concentration: Attention span and concentration are normal. Language: Speech is clear and language is normal. Fund of knowledge: Aware of current events, vocabulary appropriate for patient age.   CRANIAL NERVES: CN 2 (Optic): Visual fields intact to confrontation, funduscopic examination without optic disk pallor or edema, retinal vessels are normal. CN 3,4,6 (EOM): Pupils equal and reactive to light. Full extraocular eye movement without nystagmus. CN 5 (Trigeminal): Facial sensation is normal, no weakness of masticatory muscles. CN 7 (Facial): No facial weakness or asymmetry. CN 8 (Auditory): Auditory acuity grossly normal. CN 9,10 (Glossophar): The uvula is midline, the palate elevates symmetrically. CN 11 (spinal access): Normal sternocleidomastoid and trapezius strength. CN 12 (Hypoglossal): The tongue is midline. No atrophy or fasciculations.   MOTOR: Muscle Strength: Strength - 5/5 and symmetric in the upper and lower extremities, no pronation or drift. Muscle Tone: Tone and muscle bulk are normal in the upper and lower extremities.   REFLEXES:   DTRs - 2+ and symmetrical in all four extremities, plantar responses are flexor bilaterally.   COORDINATION:   Intact finger-to-nose, heel-to-shin, and rapid alternating movements, no  tremor.   SENSATION:  Intact to light touch, vibration, pinprick.  No sensory extinction to DSS. Negative Romberg test.  GAIT: Routine and tandem gait are normal.    DIAGNOSTIC STUDIES:    CT Head w/o contrast - 12/24/2019 No acute intracranial abnormality. Paranasal sinus inflammatory changes with opacification of the right frontal and partial opacification of the right ethmoid sinuses.  MRI Head Spine w/o contrast -10/02/2019 - No acute intracranial process. Normal unenhanced brain. No white matter lesions. - Normal unenhanced internal auditory canals without mass lesion identified. - Paranasal sinus findings as discussed, notably within the right frontal and right ethmoid sinus with presumed areas of inspissated secretions. Fungal colonization would be another consideration, but there are no aggressive features. Neoplastic disease considered less likely. Consider ENT follow-up. -Sella/suprasellar region: CSF filling majority of sella turcica with flattening of pituitary gland against the sellar floor creating an empty sella appearance. Pituitary gland measures about 2 to 2.5 mm in height. No masses. No suprasellar abnormality.  LABORATORY STUDIES:   No visits with results within 1 Year(s) from this visit.  Latest known visit with results is:  Admission on 06/04/2021, Discharged on 06/04/2021  Component Date Value Ref Range Status   WBC 06/04/2021  22.7 (H)  3.7 - 11.0 thou/mcL Final   RBC 06/04/2021 6.02 (H)  4.01 - 4.90 million/mcL Final   HGB 06/04/2021 13.7  12.2 - 14.9 gm/dL Final   HCT 98/80/7976 43.3  35.8 - 47.9 % Final   MCV 06/04/2021 72 (L)  82 - 98 fL Final   MCH 06/04/2021 22.8 (L)  27.0 - 33.0 pg Final   MCHC 06/04/2021 31.6  31.0 - 37.0 gm/dL Final   Plt Ct 98/80/7976 525 (H)  150 - 400 thou/mcL Final   RDW SD 06/04/2021 41.0  36.0 - 47.0 fL Final   MPV 06/04/2021 10.6  8.9 - 11.2 fL Final   NRBC% 06/04/2021 0.0  0 /100WBC Final   Absolute NRBC Count  06/04/2021 0.000  0 thou/mcL Final   NEUTROPHIL % 06/04/2021 84.0 (H)  50.0 - 70.0 % Final   LYMPHOCYTE % 06/04/2021 9.1 (L)  25.0 - 40.0 % Final   MONOCYTE % 06/04/2021 4.6  4.0 - 12.0 % Final   Eosinophil % 06/04/2021 1.3  1.0 - 6.0 % Final   BASOPHIL % 06/04/2021 0.2  0.0 - 2.0 % Final   IG% 06/04/2021 0.800 (H)  0.001 - 0.429 % Final   ABSOLUTE NEUTROPHIL COUNT 06/04/2021 19.04 (H)  1.50 - 7.50 thou/mcL Final   ABSOLUTE LYMPHOCYTE COUNT 06/04/2021 2.1  1.0 - 4.5 thou/mcL Final   Absolute Monocyte Count 06/04/2021 1.0 (H)  0.1 - 0.8 thou/mcL Final   Absolute Eosinophil Count 06/04/2021 0.3  0.0 - 0.5 thou/mcL Final   Absolute Basophil Count 06/04/2021 0.0  0.0 - 0.2 thou/mcL Final   Absolute Immature Granulocyte Count 06/04/2021 0.170 (H)  0.001 - 0.031 thou/mcL  Final   Na 06/04/2021 137  136 - 146 mmol/L Final   Potassium 06/04/2021 3.4 (L)  3.7 - 5.4 mmol/L Final   Cl 06/04/2021 102  97 - 108 mmol/L Final   CO2 06/04/2021 22  20 - 32 mmol/L Final   AGAP 06/04/2021 13  7 - 16 mmol/L Final   Glucose 06/04/2021 133 (H)  65 - 99 mg/dL Final   BUN 98/80/7976 17  6 - 24 mg/dL Final   Creatinine 98/80/7976 0.69  0.57 - 1.00 mg/dL Final   Ca 98/80/7976 9.3  8.7 - 10.2 mg/dL Final   ALK PHOS 98/80/7976 99  25 - 150 U/L Final   T Bili 06/04/2021 0.76  0.00 - 1.20 mg/dL Final   Total Protein 98/80/7976 7.2  6.0 - 8.5 gm/dL Final   Alb 98/80/7976 4.0  3.5 - 5.5 gm/dL Final   GLOBULIN 98/80/7976 3.2  1.5 - 4.5 gm/dL Final   ALBUMIN/GLOBULIN RATIO 06/04/2021 1.3  1.1 - 2.5 Final   BUN/CREAT RATIO 06/04/2021 24.6  11.0 - 26.0 Final   ALT 06/04/2021 9  0 - 40 U/L Final   AST 06/04/2021 12  0 - 40 U/L Final   eGFR 06/04/2021 113  mL/min/1.41m2 Final   Beta HCG Qual 06/04/2021 Negative  Negative Final   POC HCG 06/04/2021 Negative  Negative Final   Lot Number 06/04/2021 [id number]   Final   Expiration Date 06/04/2021 08/15/2022   Final   INTERNAL QC CHECK  PERFORMED 06/04/2021 Acceptable   Final   Lactic Acid 06/04/2021 1.3  0.5 - 2.0 mmol/L Final   Urine Color 06/04/2021 Yellow  Yellow  Final   Urine Clarity 06/04/2021 Clear  Clear Final   Urine Specific Gravity 06/04/2021 1.027  1.005 - 1.030 Final   Urine pH 06/04/2021 6.0  5 to 9 Final   Urine Protein - Dipstick 06/04/2021 20 (A)  Negative mg/dl Final   Urine Glucose 98/80/7976 Negative  Negative mg/dL Final   Urine Ketones 98/80/7976 10 (A)  Negative mg/dl Final   Urine Bilirubin 06/04/2021 Negative  Negative mg/dL Final   Urine Blood 98/80/7976 Negative  Negative mg/dL Final   Urine Nitrite 98/80/7976 Negative  Negative Final   Urine Urobilinogen 06/04/2021 <2  <2 mg/dl Final   Urine Leukocyte Esterase 06/04/2021 Negative  Negative Leu/mcL Final   UA Microscopic 06/04/2021 No Micro  No Micro Final      Assessment   43 y.o. female with PMHx as above who presents for evaluation of headaches, pressure sensation of the head, blurry vision, dizziness, and brain fog that has been ongoing for approximately 2 months.  Patient reports having approximately 5 headache days per week.  She also endorses some tinnitus as well as hearing a heartbeat type sensation.  Patient's neurological examination is at her baseline.  She has not had any eye evaluations for the last 1 to 2 years.  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  Patient's symptoms are concerning for potential idiopathic intracranial hypertension but could also be secondary to headaches and migraines.  I recommend that we proceed with an MRI of the brain to assess for any potential structural abnormalities.  Will go and begin her on topiramate for headache prophylaxis as well as treatment of potential IIH.  If patient is diagnosed with IIH, we will likely switch to Diamox.  However, at this time, hopefully the Topamax will give her some benefit for potential IIH as well as headaches and  migraines.  Will also try her on Maxalt for abortive therapy.   Plan   1. Frequent headaches (Primary) - Concern for IIH - MRI Head WO Contrast; Future - Ambulatory Referral to Ophthalmology; Future - topiramate (TOPAMAX) 50 MG tablet; 1 tab by mouth at bedtime for 1 week, then 1 tab twice a day  Dispense: 60 tablet; Refill: 3 - rizatriptan (MAXALT) 10 MG tablet; 1 tab by mouth at the onset of a headache.  May repeat in 2 hours if needed.  Do not exceed more than two doses in a 24 hour period.  Dispense: 9 tablet; Refill: 3   Continue Tylenol , ibuprofen  for residual headache  Discussed the importance of improved diet Recommended cardiovascular exercise for headache prevention Stay well hydrated Maintain headache calendar Discussed avoiding triggers  Advised Pt to monitor for worrisome signs and symptoms, including neurosensory/motor deficits, difficulty articulating, visual disturbances, dizziness, neck stiffness, decreased cognition, worsening pain, and N/V/F/C.  Follow up with primary provider.  Present to ER with new or worsening symptoms.  Risks, benefits, and alternatives of the medications and treatment plan prescribed today were discussed, and patient expressed understanding and agreement with the plan.  All new prescription medications and changes in current prescription dosages were discussed with the patient, including patient education, medication name, use, dosage, potential side effects, drug interactions, consequences of not using/taking and special instructions. Patient expressed understanding. No barriers to adherence.  Follow up in about 2 months (around 04/15/2024).  Electronically Signed: Fonda FREDRIK Minus, DNP, AGNP-C Neurology Nurse Practitioner  [facility] [place] 02/14/2024 2:40 PM  *This note was dictated with voice recognition software. Inadvertently, similar sounding words can, sometimes, get transcribed incorrectly         [1] Social History Socioeconomic History   Marital status:  Married  Tobacco Use   Smoking status: Every Day    Current packs/day: 0.25    Average packs/day: 0.3 packs/day for 24.7 years (6.2 ttl pk-yrs)    Types: Cigarettes    Start date: 2001   Smokeless tobacco: Never  Vaping Use   Vaping status: Never Used  Substance and Sexual Activity   Alcohol use: No   Drug use: Yes    Types: Marijuana

## 2024-04-30 ENCOUNTER — Emergency Department (HOSPITAL_BASED_OUTPATIENT_CLINIC_OR_DEPARTMENT_OTHER)

## 2024-04-30 ENCOUNTER — Emergency Department (HOSPITAL_BASED_OUTPATIENT_CLINIC_OR_DEPARTMENT_OTHER)
Admission: EM | Admit: 2024-04-30 | Discharge: 2024-04-30 | Disposition: A | Discharge status: Home / Self Care | Attending: Emergency Medicine | Admitting: Emergency Medicine

## 2024-04-30 ENCOUNTER — Emergency Department (HOSPITAL_COMMUNITY)

## 2024-04-30 ENCOUNTER — Other Ambulatory Visit: Payer: Self-pay

## 2024-04-30 DIAGNOSIS — R202 Paresthesia of skin: Secondary | ICD-10-CM | POA: Diagnosis not present

## 2024-04-30 DIAGNOSIS — R29810 Facial weakness: Secondary | ICD-10-CM | POA: Diagnosis present

## 2024-04-30 DIAGNOSIS — R519 Headache, unspecified: Secondary | ICD-10-CM | POA: Diagnosis not present

## 2024-04-30 DIAGNOSIS — G43109 Migraine with aura, not intractable, without status migrainosus: Secondary | ICD-10-CM

## 2024-04-30 LAB — DIFFERENTIAL
Abs Immature Granulocytes: 0.04 K/uL (ref 0.00–0.07)
Basophils Absolute: 0.1 K/uL (ref 0.0–0.1)
Basophils Relative: 1 %
Eosinophils Absolute: 0.2 K/uL (ref 0.0–0.5)
Eosinophils Relative: 2 %
Immature Granulocytes: 1 %
Lymphocytes Relative: 30 %
Lymphs Abs: 2.7 K/uL (ref 0.7–4.0)
Monocytes Absolute: 0.5 K/uL (ref 0.1–1.0)
Monocytes Relative: 6 %
Neutro Abs: 5.4 K/uL (ref 1.7–7.7)
Neutrophils Relative %: 60 %

## 2024-04-30 LAB — CBC
HCT: 35.7 % — ABNORMAL LOW (ref 36.0–46.0)
Hemoglobin: 10.8 g/dL — ABNORMAL LOW (ref 12.0–15.0)
MCH: 21.3 pg — ABNORMAL LOW (ref 26.0–34.0)
MCHC: 30.3 g/dL (ref 30.0–36.0)
MCV: 70.4 fL — ABNORMAL LOW (ref 80.0–100.0)
Platelets: 316 K/uL (ref 150–400)
RBC: 5.07 MIL/uL (ref 3.87–5.11)
RDW: 15.9 % — ABNORMAL HIGH (ref 11.5–15.5)
WBC: 8.9 K/uL (ref 4.0–10.5)
nRBC: 0 % (ref 0.0–0.2)

## 2024-04-30 LAB — COMPREHENSIVE METABOLIC PANEL WITH GFR
ALT: 11 U/L (ref 0–44)
AST: 17 U/L (ref 15–41)
Albumin: 4.1 g/dL (ref 3.5–5.0)
Alkaline Phosphatase: 91 U/L (ref 38–126)
Anion gap: 12 (ref 5–15)
BUN: 10 mg/dL (ref 6–20)
CO2: 24 mmol/L (ref 22–32)
Calcium: 9.6 mg/dL (ref 8.9–10.3)
Chloride: 102 mmol/L (ref 98–111)
Creatinine, Ser: 0.6 mg/dL (ref 0.44–1.00)
GFR, Estimated: >60 mL/min (ref >60)
Glucose, Bld: 90 mg/dL (ref 70–99)
Potassium: 3.5 mmol/L (ref 3.5–5.1)
Sodium: 138 mmol/L (ref 135–145)
Total Bilirubin: 0.7 mg/dL (ref 0.0–1.2)
Total Protein: 7.4 g/dL (ref 6.5–8.1)

## 2024-04-30 LAB — PROTIME-INR
INR: 1.1 (ref 0.8–1.2)
Prothrombin Time: 15 s (ref 11.4–15.2)

## 2024-04-30 LAB — ETHANOL: Alcohol, Ethyl (B): <15 mg/dL (ref <15)

## 2024-04-30 LAB — APTT: aPTT: 38 s — ABNORMAL HIGH (ref 24–36)

## 2024-04-30 LAB — CBG MONITORING, ED: Glucose-Capillary: 72 mg/dL (ref 70–99)

## 2024-04-30 MED ORDER — PROCHLORPERAZINE EDISYLATE 10 MG/2ML IJ SOLN
10.0000 mg | Freq: Once | INTRAMUSCULAR | Status: AC
  Administered 2024-04-30: 22:00:00 10 mg via INTRAVENOUS
  Filled 2024-04-30: qty 2

## 2024-04-30 MED ORDER — SODIUM CHLORIDE 0.9 % IV BOLUS
1000.0000 mL | Freq: Once | INTRAVENOUS | Status: AC
  Administered 2024-04-30: 14:00:00 1000 mL via INTRAVENOUS

## 2024-04-30 MED ORDER — DEXAMETHASONE SOD PHOSPHATE PF 10 MG/ML IJ SOLN
10.0000 mg | Freq: Once | INTRAMUSCULAR | Status: AC
  Administered 2024-04-30: 22:00:00 10 mg via INTRAVENOUS

## 2024-04-30 MED ORDER — SODIUM CHLORIDE 0.9% FLUSH
3.0000 mL | Freq: Once | INTRAVENOUS | Status: AC
  Administered 2024-04-30: 14:00:00 3 mL via INTRAVENOUS

## 2024-04-30 MED ORDER — DIPHENHYDRAMINE HCL 50 MG/ML IJ SOLN
25.0000 mg | Freq: Once | INTRAMUSCULAR | Status: AC
  Administered 2024-04-30: 22:00:00 25 mg via INTRAVENOUS
  Filled 2024-04-30: qty 1

## 2024-04-30 MED ORDER — GADOBUTROL 1 MMOL/ML IV SOLN
10.0000 mL | Freq: Once | INTRAVENOUS | Status: AC | PRN
  Administered 2024-04-30: 20:00:00 10 mL via INTRAVENOUS

## 2024-04-30 MED ORDER — IOHEXOL 350 MG/ML SOLN
75.0000 mL | Freq: Once | INTRAVENOUS | Status: AC | PRN
  Administered 2024-04-30: 16:00:00 75 mL via INTRAVENOUS

## 2024-04-30 MED ORDER — METOCLOPRAMIDE HCL 5 MG/ML IJ SOLN
10.0000 mg | Freq: Once | INTRAMUSCULAR | Status: AC
  Administered 2024-04-30: 14:00:00 10 mg via INTRAVENOUS
  Filled 2024-04-30: qty 2

## 2024-04-30 MED ORDER — DIPHENHYDRAMINE HCL 25 MG PO CAPS
25.0000 mg | ORAL_CAPSULE | Freq: Once | ORAL | Status: AC
  Administered 2024-04-30: 14:00:00 25 mg via ORAL
  Filled 2024-04-30: qty 1

## 2024-04-30 MED ORDER — SODIUM CHLORIDE 0.9 % IV BOLUS
1000.0000 mL | Freq: Once | INTRAVENOUS | Status: AC
  Administered 2024-04-30: 22:00:00 1000 mL via INTRAVENOUS

## 2024-04-30 NOTE — ED Provider Notes (Signed)
 Patient was accepted when the patient was transferred to this hospital.  She was transferred for MRI of the brain, she has a prior history of Bell's palsy, presenting with complaints of numbness to the right side of the face and the tongue as well as some intermittent numbness and tingling to the arm associated with a headache that got worse today.  The patient was given IV fluids, she states that she feels better and does not last anymore, she appears stable for discharge.  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   Cleotilde Rogue, MD 04/30/24 [phone]

## 2024-04-30 NOTE — ED Provider Notes (Signed)
 [place] EMERGENCY DEPARTMENT AT [facility] Provider Note   CSN: [id number] Arrival date & time: 04/30/24  1250     Patient presents with: No chief complaint on file.   Michele Walker is a 43 y.o. female.   HPI  Patient is a 43 year old female with past medical history significant for hep C, pancreatitis, Bell's palsy x 3, history of migraines, cholecystectomy  Patient presents emergency room today with complaints of headache this morning around 6 AM she states that is right-sided moderately severe pain approximately 7 or 8 out of 10.  She is also having some neck pain radiates down the right side of her neck.  She indicates that she has some right upper extremity tingling sensations and feels somewhat weaker and has more right facial droop.  The symptoms she states were certainly not present at 11:30 AM.       Prior to Admission medications  Medication Sig Start Date End Date Taking? Authorizing Provider  ALPRAZolam (XANAX) 0.5 MG tablet Take 0.5 mg by mouth daily as needed for anxiety or sleep. 07/05/22   [provider]  alum & mag hydroxide-simeth (MAALOX/MYLANTA) 200-200-20 MG/5ML suspension Take 30 mLs by mouth every 4 (four) hours as needed for indigestion or heartburn. 08/24/22   Cheryle Page, MD  methocarbamol  (ROBAXIN ) 500 MG tablet Take 1 tablet (500 mg total) by mouth every 6 (six) hours as needed for muscle spasms (back pain). 08/24/22   Cheryle Page, MD  omeprazole  (PRILOSEC) 40 MG capsule Take 1 capsule (40 mg total) by mouth daily. 08/25/22   Cheryle Page, MD  ondansetron  (ZOFRAN ) 4 MG tablet Take 1 tablet (4 mg total) by mouth every 8 (eight) hours as needed for nausea or vomiting. 08/24/22   Cheryle Page, MD  oxyCODONE  (OXY IR/ROXICODONE ) 5 MG immediate release tablet Take 1 tablet (5 mg total) by mouth every 6 (six) hours as needed for moderate pain or severe pain. 08/24/22   Cheryle Page, MD  polyethylene glycol (MIRALAX  / GLYCOLAX ) 17 g packet  Take 17 g by mouth daily as needed for moderate constipation. 08/24/22   Cheryle Page, MD  senna-docusate (SENOKOT-S) 8.6-50 MG tablet Take 1 tablet by mouth 2 (two) times daily. 08/24/22   Cheryle Page, MD  venlafaxine XR (EFFEXOR XR) 150 MG 24 hr capsule Take 150 mg by mouth daily. 04/20/22   [provider]  ferrous sulfate 325 (65 FE) MG tablet Take 325 mg by mouth daily with breakfast.  04/02/14  [provider]  metoCLOPramide  (REGLAN ) 10 MG tablet Take 1 tablet (10 mg total) by mouth every 6 (six) hours. 08/25/13 04/02/14  Tharon Glendale CROME, MD    Allergies: Morphine  and codeine and Penicillins    Review of Systems  Updated Vital Signs BP (!) 122/90   Pulse 80   Temp 97.9 F (36.6 C)   Resp 18   Ht 5' 7 (1.702 m)   Wt 122.5 kg   SpO2 100%   BMI 42.29 kg/m   Physical Exam Vitals and nursing note reviewed.  Constitutional:      General: She is not in acute distress. HENT:     Head: Normocephalic and atraumatic.     Nose: Nose normal.     Mouth/Throat:     Mouth: Mucous membranes are dry.  Eyes:     General: No scleral icterus. Cardiovascular:     Rate and Rhythm: Normal rate and regular rhythm.     Pulses: Normal pulses.  Heart sounds: Normal heart sounds.  Pulmonary:     Effort: Pulmonary effort is normal. No respiratory distress.     Breath sounds: No wheezing.  Abdominal:     Palpations: Abdomen is soft.     Tenderness: There is no abdominal tenderness. There is no guarding or rebound.  Musculoskeletal:     Cervical back: Normal range of motion.     Right lower leg: No edema.     Left lower leg: No edema.  Skin:    General: Skin is warm and dry.     Capillary Refill: Capillary refill takes less than 2 seconds.  Neurological:     Mental Status: She is alert. Mental status is at baseline.  Psychiatric:        Mood and Affect: Mood normal.        Behavior: Behavior normal.     (all labs ordered are listed, but only abnormal results are  displayed) Labs Reviewed  APTT - Abnormal; Notable for the following components:      Result Value   aPTT 38 (*)    All other components within normal limits  CBC - Abnormal; Notable for the following components:   Hemoglobin 10.8 (*)    HCT 35.7 (*)    MCV 70.4 (*)    MCH 21.3 (*)    RDW 15.9 (*)    All other components within normal limits  PROTIME-INR  DIFFERENTIAL  COMPREHENSIVE METABOLIC PANEL WITH GFR  ETHANOL  PREGNANCY, URINE  CBG MONITORING, ED  CBG MONITORING, ED    EKG: None  Radiology: CT ANGIO HEAD NECK W WO CM Result Date: 04/30/2024 EXAM: CTA HEAD AND NECK WITHOUT AND WITH 04/30/2024 04:19:32 PM TECHNIQUE: CTA of the head and neck was performed without and with the administration of 75 mL of iohexol  (OMNIPAQUE ) 350 MG/ML injection. Multiplanar 2D and/or 3D reformatted images are provided for review. Automated exposure control, iterative reconstruction, and/or weight based adjustment of the mA/kV was utilized to reduce the radiation dose to as low as reasonably achievable. Stenosis of the internal carotid arteries measured using NASCET criteria. COMPARISON: CTA head and neck 05/02/2019 CLINICAL HISTORY: Headache, no red flags. Right sided headache and neck pain. Right arm and facial tingling. FINDINGS: CTA NECK: AORTIC ARCH AND ARCH VESSELS: No dissection or arterial injury. No significant stenosis of the brachiocephalic or subclavian arteries. CERVICAL CAROTID ARTERIES: Minimal calcified plaque at the left carotid bifurcation. No dissection, arterial injury, or hemodynamically significant stenosis by NASCET criteria. CERVICAL VERTEBRAL ARTERIES: Dominant right vertebral artery. No dissection, arterial injury, or significant stenosis. LUNGS AND MEDIASTINUM: Unremarkable. SOFT TISSUES: No acute abnormality. BONES: No acute abnormality. CTA HEAD: ANTERIOR CIRCULATION: The intracranial internal carotid arteries are patent with mild atherosclerotic calcification bilaterally not  resulting in a significant stenosis. ACAs and MCAs are patent without evidence of a proximal branch lesion or significant proximal stenosis. The left A1 segment is dominant. No aneurysm. POSTERIOR CIRCULATION: The intracranial vertebral arteries are widely patent to the basilar. Patent PICA and SCA origins are visualized bilaterally. The basilar artery is widely patent. There are large right and diminutive or absent left posterior communicating arteries with hypoplasia of the right P1 segment. Both PCAs are patent without evidence of a significant proximal stenosis. No aneurysm. OTHER: No dural venous sinus thrombosis on this non-dedicated study. IMPRESSION: 1. Minimal atherosclerosis without a large vessel occlusion, hemodynamically significant stenosis, or aneurysm in the head or neck. Electronically signed by: Dasie Hamburg MD 04/30/2024 04:30 PM EST RP  Workstation: [id number]   CT HEAD CODE STROKE WO CONTRAST Result Date: 04/30/2024 EXAM: CT HEAD WITHOUT CONTRAST 04/30/2024 02:08:49 PM TECHNIQUE: CT of the head was performed without the administration of intravenous contrast. Automated exposure control, iterative reconstruction, and/or weight based adjustment of the mA/kV was utilized to reduce the radiation dose to as low as reasonably achievable. COMPARISON: CTA head and neck 05/02/2019. CLINICAL HISTORY: Neuro deficit, acute, stroke suspected. Right sided headache and neck pain with right arm and facial tingling. FINDINGS: BRAIN AND VENTRICLES: There is no evidence of an acute infarct, intracranial hemorrhage, mass, midline shift, hydrocephalus, or extra-axial fluid collection. A partially empty sella is unchanged. Cerebral volume is normal. No hyperdense vessel. ORBITS: No acute abnormality. SINUSES: Chronic complete right frontal sinus and anterior right ethmoid air cell opacification, unchanged. Clear mastoid air cells. SOFT TISSUES AND SKULL: No acute soft tissue abnormality. No skull fracture. [place]  Stroke Program Early CT Score (ASPECTS) ----- Ganglionic (caudate, IC, lentiform nucleus, insula, M1-M3): 7 Supraganglionic (M4-M6): 3 Total: 10 These results were communicated by telephone to PA [facility] on 04/30/24 at 2:16 pm. IMPRESSION: 1. No acute intracranial abnormality. ASPECTS of 10. Electronically signed by: Dasie Hamburg MD 04/30/2024 02:17 PM EST RP Workstation: [id number]     Procedures   Medications Ordered in the ED  sodium chloride  flush (NS) 0.9 % injection 3 mL (3 mLs Intravenous Given 04/30/24 1429)  metoCLOPramide  (REGLAN ) injection 10 mg (10 mg Intravenous Given 04/30/24 1425)  diphenhydrAMINE  (BENADRYL ) capsule 25 mg (25 mg Oral Given 04/30/24 1425)  sodium chloride  0.9 % bolus 1,000 mL (0 mLs Intravenous Stopped 04/30/24 1531)  iohexol  (OMNIPAQUE ) 350 MG/ML injection 75 mL (75 mLs Intravenous Contrast Given 04/30/24 1612)    Clinical Course as of 04/30/24 1739  Mon Apr 30, 2024  1353 First patient interaction: LKW 11:30 Mild RUE weakness Worse R facial droop  [WF]    Clinical Course User Index [WF] Neldon Hamp RAMAN, PA                                 Medical Decision Making Amount and/or Complexity of Data Reviewed Labs: ordered. Radiology: ordered. ECG/medicine tests: ordered.  Risk Prescription drug management.   This patient presents to the ED for concern of facial weakness, this involves a number of treatment options, and is a complaint that carries with it a moderate risk of complications and morbidity. A differential diagnosis was considered for the patient's symptoms which is discussed below:   Patient with facial weakness concern for stroke versus Bell's palsy versus psychiatric condition versus less likely tick borne illness as patient has not had any recent tick exposure   Co morbidities: Discussed in HPI   Brief History:  Patient is a 43 year old female with past medical history significant for hep C, pancreatitis, Bell's palsy x 3,  history of migraines, cholecystectomy  Patient presents emergency room today with complaints of headache this morning around 6 AM she states that is right-sided moderately severe pain approximately 7 or 8 out of 10.  She is also having some neck pain radiates down the right side of her neck.  She indicates that she has some right upper extremity tingling sensations and feels somewhat weaker and has more right facial droop.  The symptoms she states were certainly not present at 11:30 AM.    EMR reviewed including pt PMHx, past surgical history and past visits to ER.   See  HPI for more details   Lab Tests:   I personally reviewed all laboratory work and imaging. Metabolic panel without any acute abnormality specifically kidney function within normal limits and no significant electrolyte abnormalities. CBC without leukocytosis, there is anemia which does not appear to be acute   Imaging Studies:  NAD. I personally reviewed all imaging studies and no acute abnormality found. I agree with radiology interpretation.    Cardiac Monitoring:  The patient was maintained on a cardiac monitor.  I personally viewed and interpreted the cardiac monitored which showed an underlying rhythm of: NSR    Medicines ordered:  I ordered medication including 1 L normal saline, Benadryl , Reglan  Reevaluation of the patient after these medicines showed that the patient improved I have reviewed the patients home medicines and have made adjustments as needed   Critical Interventions:     Consults/Attending Physician   I requested consultation with Dr. Matthews of neurology,  and discussed lab and imaging findings as well as pertinent plan - they recommend: MRI brain with and without contrast   Reevaluation:  After the interventions noted above I re-evaluated patient and found that they have :improved   Social Determinants of Health:      Problem List / ED Course:  Patient is a 43 year old  female with past medical history significant for migraines, Bell's palsy, residual deficits from Bell's palsy including right facial weakness she presents emergency room today with complaints of some subjective right upper extremity weakness, right upper extremity tingling, facial droop.  She states that last known well was 11:30 AM.  She endorses headache that has been ongoing since 6 AM.  Initially called a code stroke on this patient.  CT angio head and neck are reassuring.  Discussed with Dr. Matthews of neurology who recommended transfer for MRI.  I think this is reasonable given her symptoms however have a low suspicion for stroke.  Concern for stroke with psychiatric overlay versus psychiatric condition versus Bell's palsy   Dispostion:  After consideration of the diagnostic results and the patients response to treatment, I feel that the patent would benefit from MRI transfer  Final diagnoses:  Facial droop    ED Discharge Orders     None          Neldon Hamp RAMAN, [place] 04/30/24 1803    Yolande Lamar BROCKS, MD 05/01/24 1536

## 2024-04-30 NOTE — Consult Note (Signed)
 NEUROLOGY TELESTROKE CONSULT NOTE   Date of service: April 30, 2024 Patient Name: Michele Walker MRN:  [id number] DOB:  February 15, 1981 Chief Complaint: R sided headache, R sided neck pain radiating to R elbow, R sided numbness (face and arm) Requesting Provider: Bari Roxie HERO, DO  Consult Participants: myself, bedside RN, telestroke RN Location of the provider: [facility] Location of the patient: [facility]  This consult was provided via telemedicine with 2-way video and audio communication. The patient/family was informed that care would be provided in this way and agreed to receive care in this manner.   History of Present Illness   Neurology paged [phone] Neurology on camera 1402  Michele Walker is a 43 y.o. female with hx hep C, pancreatitis, remote Bell's palsy, remote hx migraines 10+ yrs ago who presents with acute onset R sided numbness. Patient reports that she does not typically have headaches but woke up with one this AM (pain did not wake her from sleep). Pain 6/10 in severity, R sided, throbbing. She is also having new neck pain that radiates to her R shoulder and down to the level of her elbow. She had no new neurologic deficits until after her LKW of 1130 today when she developed numbness in her R face, R tongue, and R arm. No other acute focal deficits. NIHSS = 3 for questions, R facial droop (chronic 2/2 remote Bell's palsy) and R sided sensory deficit. CT head personal review showed no acute abnormality ASPECTS 10. TNK not administered 2/2 too mild to treat and high suspicion for atypical migraine than for stroke.  She was last seen by neurology Sept 2025 at [place]. Note reviewed in care everywhere, per that provider patient presented with worsening headaches for several months with a frequency of 5 out of 7 days per week. IIH was considered and patient was started on topiramate. However per patient that documentation is incorrect and she has not had a headache since she was pregnant 13  years ago.   She has hx recurrent Bell's palsy, once on one side and twice on the other (does not remember laterality) and has chronic R upper and lower facial weakness with ipsilateral ptosis.   LKW: 1130 Modified rankin score: 1-No significant post stroke disability and can perform usual duties with stroke symptoms IV Thrombolysis: no too mild to treat and favor complex migraine over acute ischemic stroke   NIHSS components Score: Comment  1a Level of Conscious 0[]  1[]  2[]  3[]      1b LOC Questions 0[]  1[x]  2[]       1c LOC Commands 0[]  1[]  2[]       2 Best Gaze 0[]  1[]  2[]       3 Visual 0[]  1[]  2[]  3[]      4 Facial Palsy 0[]  1[x]  2[]  3[]      5a Motor Arm - left 0[]  1[]  2[]  3[]  4[]  UN[]    5b Motor Arm - Right 0[]  1[]  2[]  3[]  4[]  UN[]    6a Motor Leg - Left 0[]  1[]  2[]  3[]  4[]  UN[]    6b Motor Leg - Right 0[]  1[]  2[]  3[]  4[]  UN[]    7 Limb Ataxia 0[]  1[]  2[]  3[]  UN[]     8 Sensory 0[]  1[x]  2[]  UN[]      9 Best Language 0[]  1[]  2[]  3[]      10 Dysarthria 0[]  1[]  2[]  UN[]      11 Extinct. and Inattention 0[]  1[]  2[]       TOTAL:  3      ROS   Comprehensive ROS performed  and pertinent positives documented in HPI   Past History   Past Medical History:  Diagnosis Date   Hepatitis C    Hepatitis C     Past Surgical History:  Procedure Laterality Date   CESAREAN SECTION     CHOLECYSTECTOMY     TUBAL LIGATION      Family History: Family History  Problem Relation Age of Onset   Healthy Mother    Healthy Father     Social History  reports that she has been smoking cigarettes. She has never used smokeless tobacco. She reports current drug use. Drug: Marijuana. She reports that she does not drink alcohol.  Allergies[1]  Medications  Current Medications[2]  Vitals   Vitals:   05-11-2024 1334 05/11/2024 1335  BP: (!) 155/116   Pulse: (!) 101   Resp: 20   Temp: 97.9 F (36.6 C)   SpO2: 100%   Weight:  122.5 kg  Height:  5' 7 (1.702 m)    Body mass index is 42.29  kg/m.  Physical Exam   Physical Exam Gen: A&O x4, NAD Resp: normal WOB CV: extremities appear well-perfused  Neurologic Examination   Neuro: *MS: A&O x4. Stated age as 2 but is 43 yo. Follows multi-step commands.  *Speech: nondysarthric, no aphasia, able to name and repeat *CN: PERRL 3mm, EOMI, VFF by confrontation, R sensory deficit, impaired elevation of forehead on R, mild R ptosis, R lower facial droop (all chronic 2/2 hx Bell's palsy), hearing intact to voice *Motor:   Normal bulk.  No tremor, rigidity or bradykinesia. No pronator drift. All extremities appear full-strength and symmetric. *Sensory: RUE impaired sensation. Symmetric. No double-simultaneous extinction.  *Coordination:  Finger-to-nose, heel-to-shin, rapid alternating motions were intact. *Reflexes:  UTA 2/2 tele-exam *Gait: deferred  Labs/Imaging/Neurodiagnostic studies   CBC:  Recent Labs  Lab 05/11/24 1354  WBC 8.9  NEUTROABS 5.4  HGB 10.8*  HCT 35.7*  MCV 70.4*  PLT 316   Basic Metabolic Panel:  Lab Results  Component Value Date   NA 139 02/23/2023   K 3.5 02/23/2023   CO2 28 02/23/2023   GLUCOSE 117 (H) 02/23/2023   BUN 11 02/23/2023   CREATININE 0.60 02/23/2023   CALCIUM 9.5 02/23/2023   GFRNONAA >60 02/23/2023   GFRAA >60 11/21/2014   Lipid Panel: No results found for: LDLCALC HgbA1c: No results found for: HGBA1C Urine Drug Screen: No results found for: LABOPIA, COCAINSCRNUR, LABBENZ, AMPHETMU, THCU, LABBARB  Alcohol Level No results found for: [facility] INR  Lab Results  Component Value Date   INR 1.1 2024-05-11   APTT  Lab Results  Component Value Date   APTT 38 (H) 05/11/2024   AED levels: No results found for: PHENYTOIN, ZONISAMIDE, LAMOTRIGINE, LEVETIRACETA  CT Head without contrast(Personally reviewed): No acute process, ASPECTS 10   ASSESSMENT   Michele Walker is a 43 y.o. female with hx hep C, pancreatitis, remote Bell's palsy, remote hx  migraines 10+ yrs ago who presents with acute onset R sided numbness. Favor acute R face and arm sensory deficit to be 2/2 her current migrainous headache. She also describes what sounds like R cervical radiculopathy pain but this would not explain sensory deficit in her R face.  RECOMMENDATIONS   - Recommend headache cocktail with q30 min NIHSS and vitals until her sx resolve or until she is outside the TNK window (at 1600) whichever happens first - Page me STAT at [phone] if patient's neurologic exam declines - Can get CTA H&N in DB  ED. If her sx do not resolve with migraine cocktail she will require transfer ED to ED to [facility] for MRI brain and c spine with and without contrast.   D/w Dr. Bari and Hamp PA. Please page me with any questions. ______________________________________________________________________    Signed, Elida CHRISTELLA Ross, MD [facility]     [1]  Allergies Allergen Reactions   Morphine  And Codeine     Upper stomach pain   Penicillins     rash  [2]  Current Facility-Administered Medications:    sodium chloride  0.9 % bolus 1,000 mL, 1,000 mL, Intravenous, Once, [place], [place] S, PA   sodium chloride  flush (NS) 0.9 % injection 3 mL, 3 mL, Intravenous, Once, [place], [place] S, PA  Current Outpatient Medications:    ALPRAZolam (XANAX) 0.5 MG tablet, Take 0.5 mg by mouth daily as needed for anxiety or sleep., Disp: , Rfl:    alum & mag hydroxide-simeth (MAALOX/MYLANTA) 200-200-20 MG/5ML suspension, Take 30 mLs by mouth every 4 (four) hours as needed for indigestion or heartburn., Disp: 355 mL, Rfl: 0   methocarbamol  (ROBAXIN ) 500 MG tablet, Take 1 tablet (500 mg total) by mouth every 6 (six) hours as needed for muscle spasms (back pain)., Disp: 30 tablet, Rfl: 0   omeprazole  (PRILOSEC) 40 MG capsule, Take 1 capsule (40 mg total) by mouth daily., Disp: 15 capsule, Rfl: 0   ondansetron  (ZOFRAN ) 4 MG tablet, Take 1 tablet (4 mg total) by mouth every 8  (eight) hours as needed for nausea or vomiting., Disp: 20 tablet, Rfl: 0   oxyCODONE  (OXY IR/ROXICODONE ) 5 MG immediate release tablet, Take 1 tablet (5 mg total) by mouth every 6 (six) hours as needed for moderate pain or severe pain., Disp: 20 tablet, Rfl: 0   polyethylene glycol (MIRALAX  / GLYCOLAX ) 17 g packet, Take 17 g by mouth daily as needed for moderate constipation., Disp: 14 each, Rfl: 0   senna-docusate (SENOKOT-S) 8.6-50 MG tablet, Take 1 tablet by mouth 2 (two) times daily., Disp: 30 tablet, Rfl: 0   venlafaxine XR (EFFEXOR XR) 150 MG 24 hr capsule, Take 150 mg by mouth daily., Disp: , Rfl:

## 2024-04-30 NOTE — ED Triage Notes (Signed)
 Pt caox4 crying stating she woke up this morning with R side headache and R neck pain, around 1030 began having tingling in R arm and R side of face. LKW 2300 last night. PMH Bells palsy.

## 2024-04-30 NOTE — Discharge Instructions (Addendum)
 Your MRI is without any acute abnormal findings.  I would like you to follow-up with a neurologist and I have given you the information for an office that you can follow-up with.  You should follow-up with your own neurologist if you have 1, let them know that you had the MRI, they can follow-up your results in the electronic medical record

## 2024-04-30 NOTE — ED Notes (Signed)
 Ambulatory to bathroom.
